# Patient Record
Sex: Female | Born: 1970 | Race: White | Hispanic: No | Marital: Married | Smoking: Never smoker
Health system: Southern US, Community
[De-identification: ages and names within clinical notes are randomized; demographics above are authoritative.]

## PROBLEM LIST (undated history)

## (undated) DIAGNOSIS — Z87442 Personal history of urinary calculi: Secondary | ICD-10-CM

## (undated) DIAGNOSIS — M810 Age-related osteoporosis without current pathological fracture: Secondary | ICD-10-CM

## (undated) DIAGNOSIS — E785 Hyperlipidemia, unspecified: Secondary | ICD-10-CM

## (undated) HISTORY — PX: WISDOM TOOTH EXTRACTION: SHX21

## (undated) HISTORY — DX: Personal history of urinary calculi: Z87.442

## (undated) HISTORY — DX: Hyperlipidemia, unspecified: E78.5

## (undated) HISTORY — DX: Age-related osteoporosis without current pathological fracture: M81.0

---

## 2000-12-06 ENCOUNTER — Other Ambulatory Visit: Admission: RE | Admit: 2000-12-06 | Discharge: 2000-12-06 | Payer: Self-pay | Admitting: *Deleted

## 2001-07-21 ENCOUNTER — Other Ambulatory Visit: Admission: RE | Admit: 2001-07-21 | Discharge: 2001-07-21 | Payer: Self-pay | Admitting: Obstetrics and Gynecology

## 2002-02-06 ENCOUNTER — Inpatient Hospital Stay (HOSPITAL_COMMUNITY): Admission: AD | Admit: 2002-02-06 | Discharge: 2002-02-07 | Payer: Self-pay | Admitting: Obstetrics and Gynecology

## 2002-03-18 ENCOUNTER — Other Ambulatory Visit: Admission: RE | Admit: 2002-03-18 | Discharge: 2002-03-18 | Payer: Self-pay | Admitting: Obstetrics and Gynecology

## 2002-08-18 ENCOUNTER — Emergency Department (HOSPITAL_COMMUNITY): Admission: EM | Admit: 2002-08-18 | Discharge: 2002-08-19 | Payer: Self-pay | Admitting: Emergency Medicine

## 2002-08-18 ENCOUNTER — Encounter: Payer: Self-pay | Admitting: Emergency Medicine

## 2003-04-13 ENCOUNTER — Other Ambulatory Visit: Admission: RE | Admit: 2003-04-13 | Discharge: 2003-04-13 | Payer: Self-pay | Admitting: Obstetrics and Gynecology

## 2004-03-29 ENCOUNTER — Inpatient Hospital Stay (HOSPITAL_COMMUNITY): Admission: AD | Admit: 2004-03-29 | Discharge: 2004-03-31 | Payer: Self-pay | Admitting: Obstetrics and Gynecology

## 2004-05-16 ENCOUNTER — Other Ambulatory Visit: Admission: RE | Admit: 2004-05-16 | Discharge: 2004-05-16 | Payer: Self-pay | Admitting: Obstetrics and Gynecology

## 2005-07-12 ENCOUNTER — Other Ambulatory Visit: Admission: RE | Admit: 2005-07-12 | Discharge: 2005-07-12 | Payer: Self-pay | Admitting: Obstetrics and Gynecology

## 2005-10-21 ENCOUNTER — Encounter: Admission: RE | Admit: 2005-10-21 | Discharge: 2005-10-21 | Payer: Self-pay | Admitting: Obstetrics and Gynecology

## 2007-07-16 ENCOUNTER — Encounter: Admission: RE | Admit: 2007-07-16 | Discharge: 2007-07-16 | Payer: Self-pay | Admitting: Obstetrics and Gynecology

## 2011-12-26 ENCOUNTER — Encounter: Payer: Self-pay | Admitting: *Deleted

## 2011-12-27 ENCOUNTER — Other Ambulatory Visit: Payer: Self-pay | Admitting: Cardiology

## 2012-01-02 ENCOUNTER — Ambulatory Visit (HOSPITAL_COMMUNITY)
Admission: RE | Admit: 2012-01-02 | Discharge: 2012-01-02 | Disposition: A | Discharge status: Home / Self Care | Payer: BC Managed Care – PPO | Source: Ambulatory Visit | Attending: Cardiology | Admitting: Cardiology

## 2012-01-02 DIAGNOSIS — R079 Chest pain, unspecified: Secondary | ICD-10-CM

## 2012-01-02 DIAGNOSIS — J90 Pleural effusion, not elsewhere classified: Secondary | ICD-10-CM | POA: Insufficient documentation

## 2012-01-02 MED ORDER — NITROGLYCERIN 0.4 MG SL SUBL
SUBLINGUAL_TABLET | SUBLINGUAL | Status: AC
  Administered 2012-01-02: 0.4 mg via ORAL
  Filled 2012-01-02: qty 25

## 2012-01-02 MED ORDER — SODIUM CHLORIDE 0.9 % IV SOLN: Freq: Once | INTRAVENOUS | Status: DC

## 2012-01-02 MED ORDER — METOPROLOL TARTRATE 1 MG/ML IV SOLN
INTRAVENOUS | Status: AC
  Administered 2012-01-02: 5 mg
  Filled 2012-01-02: qty 20

## 2012-01-02 MED ORDER — IOHEXOL 350 MG/ML SOLN
80.0000 mL | Freq: Once | INTRAVENOUS | Status: AC | PRN
  Administered 2012-01-02: 80 mL via INTRAVENOUS

## 2012-01-02 MED ORDER — METOPROLOL TARTRATE 1 MG/ML IV SOLN
5.0000 mg | INTRAVENOUS | Status: DC | PRN
  Administered 2012-01-02: 5 mg via INTRAVENOUS

## 2012-01-10 ENCOUNTER — Institutional Professional Consult (permissible substitution): Payer: BC Managed Care – PPO | Admitting: Pulmonary Disease

## 2012-01-23 ENCOUNTER — Encounter: Payer: Self-pay | Admitting: Pulmonary Disease

## 2012-01-24 ENCOUNTER — Ambulatory Visit (INDEPENDENT_AMBULATORY_CARE_PROVIDER_SITE_OTHER): Payer: BC Managed Care – PPO | Admitting: Pulmonary Disease

## 2012-01-24 ENCOUNTER — Encounter: Payer: Self-pay | Admitting: Pulmonary Disease

## 2012-01-24 VITALS — BP 122/86 | HR 68 | Temp 98.1°F | Ht 66.5 in | Wt 127.4 lb

## 2012-01-24 DIAGNOSIS — R5383 Other fatigue: Secondary | ICD-10-CM

## 2012-01-24 DIAGNOSIS — R5381 Other malaise: Secondary | ICD-10-CM

## 2012-01-24 NOTE — Assessment & Plan Note (Signed)
This is a very difficult case in that it is hard to tell how much of the patient's complaint is due to fatigue versus true sleepiness.  There is nothing by history or exam to suggest sleep disordered breathing or narcolepsy, however I cannot exclude sleep disruptions due to periodic limb movements, arrhythmias, or occult nocturnal seizures.  She could potentially have idiopathic hypersomnia as well, questionably related to some type of autoimmune disease.  It is very difficult to know if the patient would benefit from a sleep evaluation, which would include NPSG And M. SLT the following morning.  I have asked the patient to consider further testing if she feels strongly her symptoms include true sleepiness.  She will discuss with her family and let me know.

## 2012-01-24 NOTE — Progress Notes (Signed)
  Subjective:    Patient ID: Janice Fox, female    DOB: [birth year], 41 y.o.   MRN: [id number]  HPI The patient is a 41 year old female who I've been asked to see for evaluation of fatigue versus sleepiness.  The patient was diagnosed with fifth disease proximally 4 years ago, and since that time she has had progressive fatigue.  She thinks she also has some sleepiness as well, however has a very difficult time defining this.  She states that her "eyes feel heavy" but she does not fall asleep.  This had worsened in January and February of this year, but now has improved.  The patient has been told that she does not snore or kick during the night, and she denies any symptoms consistent with restless leg syndrome.  She has no difficulties falling asleep or maintaining sleep, and feels that her sleep overall during the night is adequate.  However, she does not feel rested in the mornings upon arising.  She does not take naps during the day except on beer he rare occasions, and her Epworth score today is only 7.  She stays very busy in the evenings, and really doesn't watch television much or sit still.  The patient denies any sleepiness with driving, but does state that her "eyes feel heavy" the patient has never had any issues with daytime sleepiness during her teenage years or 78s.  She denies any history suggestive of narcolepsy.   Review of Systems  Constitutional: Positive for appetite change. Negative for fever and unexpected weight change.  HENT: Negative for ear pain, nosebleeds, congestion, sore throat, rhinorrhea, sneezing, trouble swallowing, dental problem, postnasal drip and sinus pressure.   Eyes: Negative for redness and itching.  Respiratory: Positive for shortness of breath. Negative for cough, chest tightness and wheezing.   Cardiovascular: Positive for chest pain. Negative for palpitations and leg swelling.  Gastrointestinal: Negative for nausea and vomiting.  Genitourinary:  Negative for dysuria.  Musculoskeletal: Positive for joint swelling.  Skin: Negative for rash.  Neurological: Positive for headaches.  Hematological: Does not bruise/bleed easily.  Psychiatric/Behavioral: Negative for dysphoric mood. The patient is not nervous/anxious.        Objective:   Physical Exam Constitutional:  Thin, no acute distress  HENT:  Nares patent without discharge  Oropharynx without exudate, palate and uvula are normal  Eyes:  Perrla, eomi, no scleral icterus  Neck:  No JVD, no TMG  Cardiovascular:  Normal rate, regular rhythm, no rubs or gallops.  No murmurs        Intact distal pulses  Pulmonary :  Normal breath sounds, no stridor or respiratory distress   No rales, rhonchi, or wheezing  Abdominal:  Soft, nondistended, bowel sounds present.  No tenderness noted.   Musculoskeletal:  No lower extremity edema noted.  Lymph Nodes:  No cervical lymphadenopathy noted  Skin:  No cyanosis noted  Neurologic:  Alert, appropriate, moves all 4 extremities without obvious deficit.         Assessment & Plan:

## 2012-01-24 NOTE — Patient Instructions (Signed)
Consider doing nocturnal sleep study followed by daytime nap study to evaluate for true sleepiness vs fatigue Please call me with your decision.

## 2013-07-11 IMAGING — CT CT HEART MORP W/ CTA COR W/ SCORE W/ CA W/CM &/OR W/O CM
1 of 5 series · 11 of 20 positions shown, 14 images · IV contrast (CONTRAST)
Comparison: None
COMPARISON: None

<!--  IDXRADR:ADDEND:BEGIN -->Addendum Begins
<!--  IDXRADR:ADDEND:INNER_BEGIN -->OVER-READ INTERPRETATION - CT CHEST

The following report is an over-read performed by radiologist Dr.
[DATE].  This over-read does not include interpretation of
cardiac or coronary anatomy or pathology.  The CTA interpretation
by the cardiologist is attached.
INDICATION: Chest Pain
PROTOCOL: The patient was scanned on a Philips 256 scanner.
Gantry rotation speed was 300 msec.  Collimation was .9mm.  The
patient received 10 mg of lopresser.  Average HR during the scan
was 62 bpm.  SL nitro was given after nonconstrast axial calcium
scoring.  A prospective scan using 5% padding at 78% of the R-R
interval was triggered in the descending thoracic aorta at 111
HU's.  iDose was [DATE] with mA reduced to 300 to save dose.  The
patient received 80 cc of contrast at 5cc/sec.  The 3D date set was
reviewed on a [employer] work station using CMPR, MIP and VRT modes.

[Series 9: w/ edge cor., 78.0% · axial · 0.49mm/px · z∈[-248,-144]mm · 11 of 276 slices shown, 14 images]
[im 23/276  vessel]
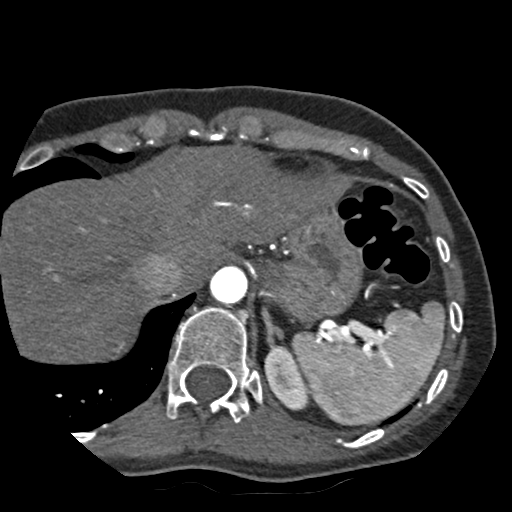
[im 23/276  lung]
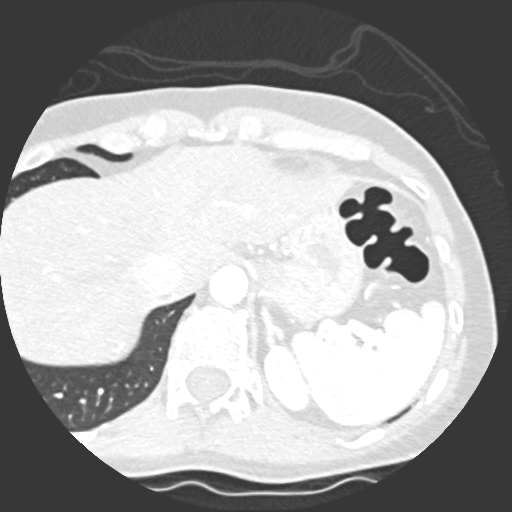
[im 46/276  vessel]
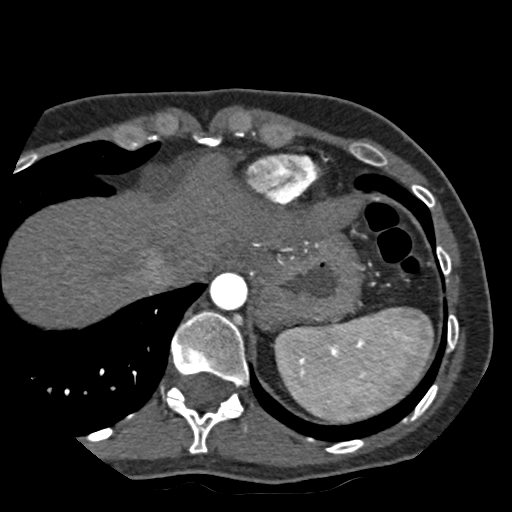
[im 69/276  vessel]
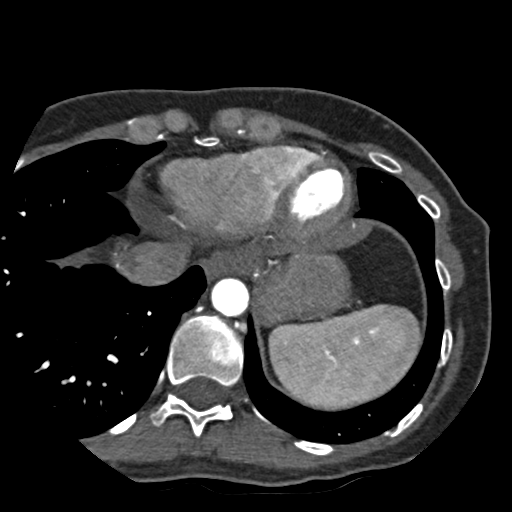
[im 92/276  vessel]
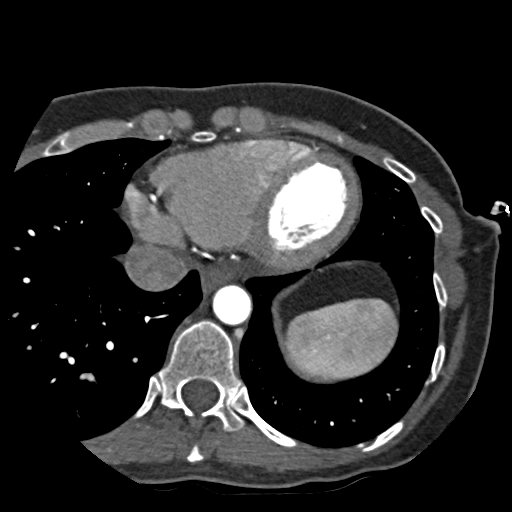
[im 115/276  vessel]
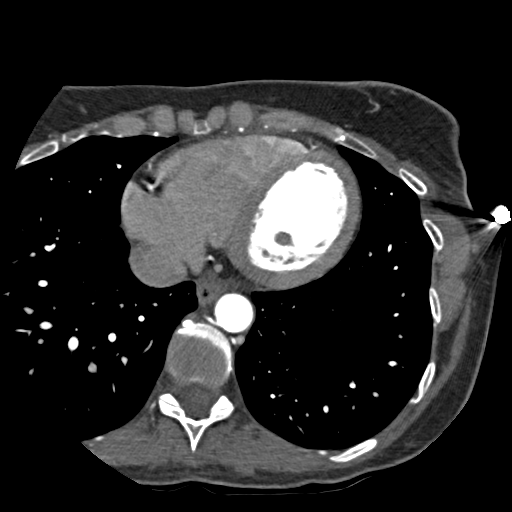
[im 115/276  lung]
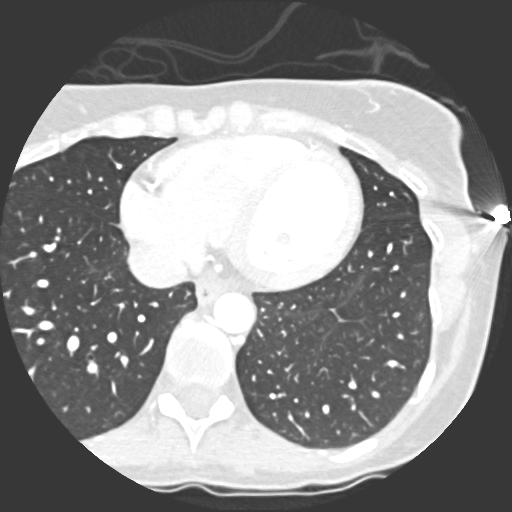
[im 138/276  vessel]
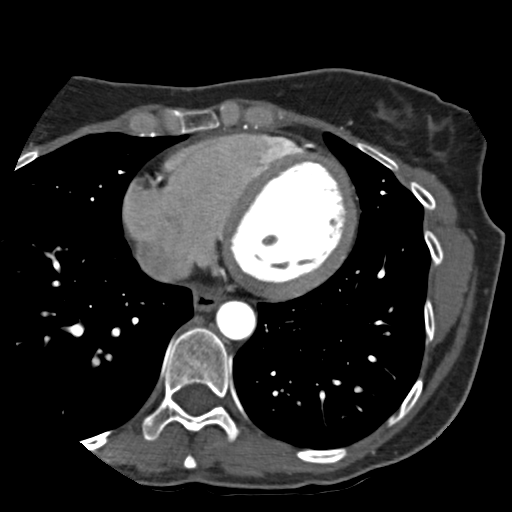
[im 161/276  vessel]
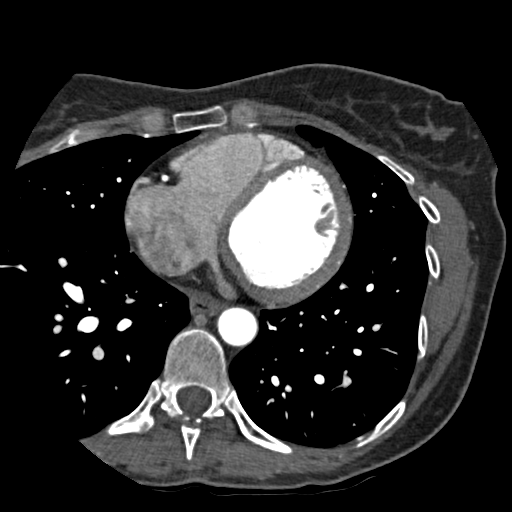
[im 184/276  vessel]
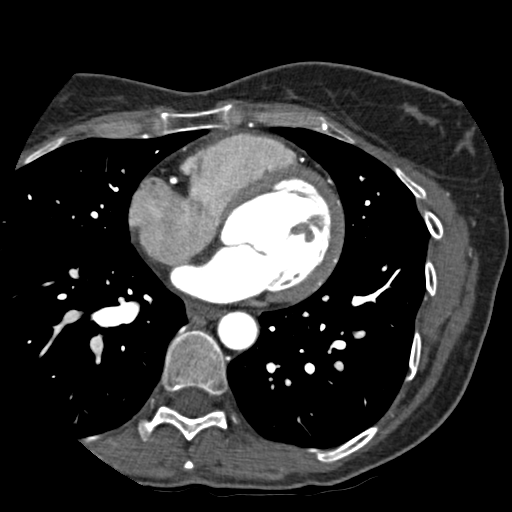
[im 207/276  vessel]
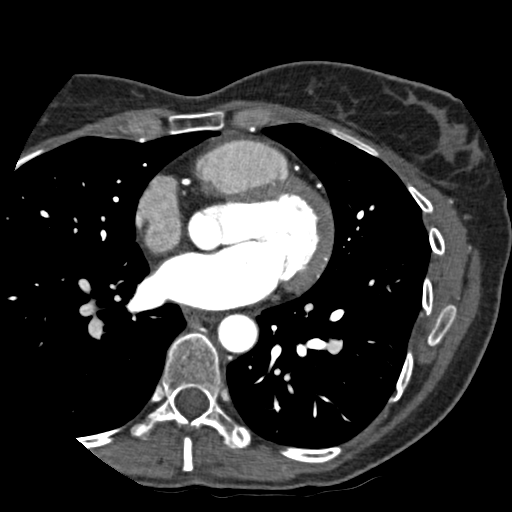
[im 207/276  lung]
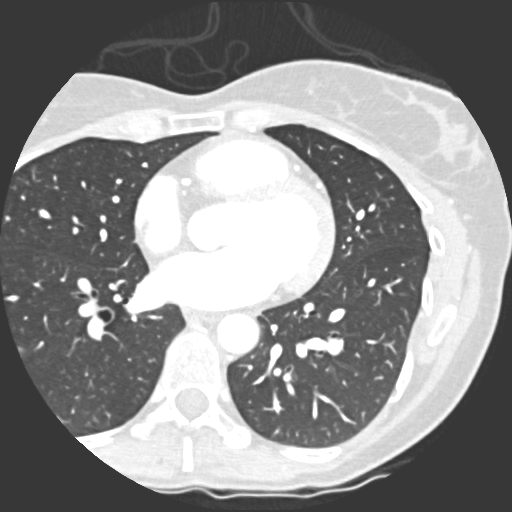
[im 230/276  vessel]
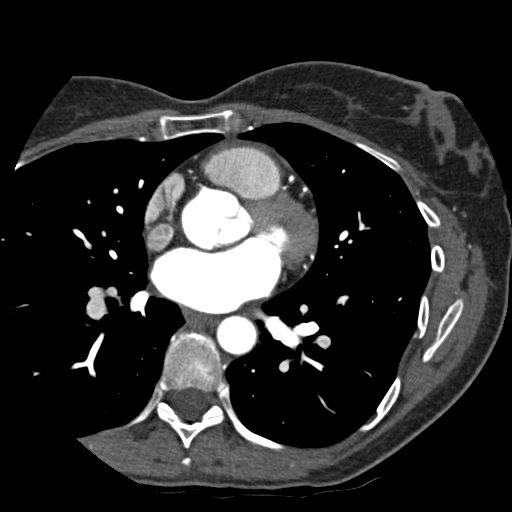
[im 253/276  vessel]
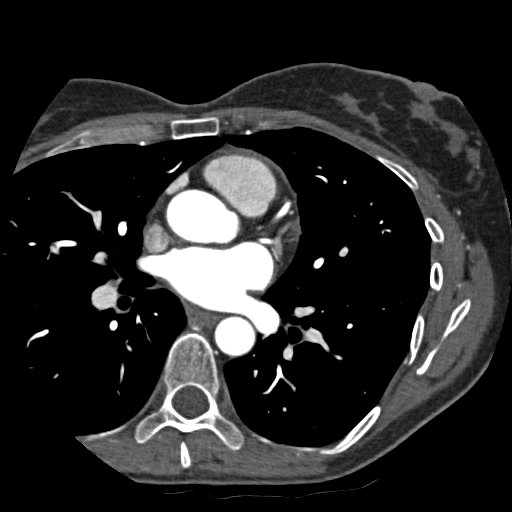

[11 of 20 positions shown; findings below may reference images not displayed]

FINDINGS: Visualized lung fields are clear.  No pleural effusions.
Small pericardial effusion noted.  No adenopathy in the visualized
lower hila or mediastinum.  Chest wall soft tissues are
unremarkable.  No acute bony abnormality.  Visualized upper abdomen
unremarkable.
IMPRESSION: Small pericardial effusion.  No additional significant extracardiac
abnormality.

Cardiac CT:
FINDINGS: Calcium Score: 0

Noncardiac:  See separate report from [REDACTED].  No
significant findings on lung or soft tissue windows

Coronary Arteries:

Right dominant with no anomalies

LM: normal

LAD: normal distal vessel is small

      D1: large and normal

Circumflex: normal

      OM1: normal and large

      AV groove: normal

RCA: dominant and normal

      PDA: normal
      PLA: small and normal

Ascending Aorta:  Normal measuring 31mm
IMPRESSION: 1)    Calcium Score 0

2)    Normal right dominant coronary arteries
3)    No significant non coronary findings see separate report from

<!--  IDXRADR:ADDEND:INNER_END -->Addendum Ends
<!--  IDXRADR:ADDEND:END -->OVER-READ INTERPRETATION - CT CHEST

The following report is an over-read performed by radiologist Dr.
[DATE].  This over-read does not include interpretation of
cardiac or coronary anatomy or pathology.  The CTA interpretation
by the cardiologist is attached.
FINDINGS: Visualized lung fields are clear.  No pleural effusions.
Small pericardial effusion noted.  No adenopathy in the visualized
lower hila or mediastinum.  Chest wall soft tissues are
unremarkable.  No acute bony abnormality.  Visualized upper abdomen
unremarkable.
IMPRESSION: Small pericardial effusion.  No additional significant extracardiac
abnormality.

## 2021-05-29 LAB — HM DEXA SCAN

## 2021-05-31 LAB — HM PAP SMEAR: HM Pap smear: NORMAL

## 2022-03-26 ENCOUNTER — Ambulatory Visit: Payer: BC Managed Care – PPO | Admitting: Nurse Practitioner

## 2022-03-26 ENCOUNTER — Encounter: Payer: Self-pay | Admitting: Nurse Practitioner

## 2022-03-26 VITALS — BP 122/66 | HR 67 | Temp 97.5°F | Ht 65.0 in | Wt 113.0 lb

## 2022-03-26 DIAGNOSIS — E782 Mixed hyperlipidemia: Secondary | ICD-10-CM

## 2022-03-26 DIAGNOSIS — R0681 Apnea, not elsewhere classified: Secondary | ICD-10-CM | POA: Diagnosis not present

## 2022-03-26 DIAGNOSIS — Z833 Family history of diabetes mellitus: Secondary | ICD-10-CM | POA: Diagnosis not present

## 2022-03-26 DIAGNOSIS — M810 Age-related osteoporosis without current pathological fracture: Secondary | ICD-10-CM

## 2022-03-26 NOTE — Patient Instructions (Addendum)
Check sleep study  Follow-up 1 year  Bone Health Information for Aging Adults Your bones do more than support your body. They also store calcium. The inside of your bones (marrow) makes blood cells. Maintaining bone health becomes more important as you age because your bones replace all their cells about every 10 years. Around age 53, it gets harder to replace those cells, and bones can become weak. Weak bones can lead to osteoporosis and breaks (fractures). Falls also become more likely, which can cause fractures. The good news is that, with diet and exercise, you can improve and maintain your bone health at any age. How to eat for bone health A balanced diet can supply many of the vitamins, minerals, and proteins you need for bone health. Older adults need to make sure they get enough calcium, vitamin D, and magnesium. You may need more of these as you age. Calcium  Calcium is the most important (essential) mineral for bone health. The daily requirement for calcium for adult men aged 60-70 years is 1,000 mg. For adult women aged 67-70 years, it is 1,200 mg. At age 4 or older, it is 1,200 mg for both men and women. Sources of calcium in your diet include: Dairy foods like milk, yogurt, and cheese. Dairy foods are the best sources. If you cannot eat dairy, you may need a calcium supplement. Leafy, dark green vegetables. These include collard greens, kale, broccoli, bok choy, and okra. Fatty fish, like sardines and canned salmon with bones. Almonds. Tofu.  Vitamin D You need vitamin D for bone health because it helps your body absorb calcium from your diet. It is not found naturally in many foods. Many people can benefit from taking a supplement. The daily requirement for vitamin D at age 79 or older is 600-1,000 international units (IU). Dietary sources for vitamin D include: Fatty fish, such as swordfish, salmon, sardine, and mackerel. Foods that have vitamin D added to them (are fortified),  like cereal and dairy products. Egg yolks. Magnesium Magnesium helps your body use both calcium and vitamin D. The recommended daily intake for adult men is 400-420 mg. For adult women, it is 310-320 mg. Dietary sources of magnesium include: Green vegetables, such as collard greens, kale, bok choy, and okra. Poppy, sesame, and chia seeds. Legumes, including peas and beans. Whole grains. Avocados. Nuts. If you drink alcohol regularly or take a type of antacid called a proton pump inhibitor, you might benefit from a magnesium supplement. How to exercise for bone health Exercise is important for bone health because it strengthens bones and muscles. Bones are living organs that get stronger when you exercise them, just like your heart and other muscles. Muscles support your bones and protect your joints. Strong muscles also help prevent bone loss and falls. Weight-bearing exercises and resistance exercises are the two types of exercise that are most important for bone health. Weight-bearing exercises include running or walking, climbing stairs, and playing sports like tennis. Resistance exercises include those done using free weights, weight machines, or resistance bands. Try to get at least 30 minutes of exercise every day. You can also include stretching, balance, and flexibility exercises, like yoga or tai chi. These lower your risk of falls. Follow these instructions at home Ask your health care provider if: You need a bone density test. This is especially important for women older than 82 and men older than 70. You have been losing height. Loss of height may be a sign of weakening bones  in your spine. Any of your medicines or medical conditions could affect your bone health. He or she could recommend an exercise program that is safe for you. Be sure it includes both weight-bearing and resistance exercises. Do not use any products that contain nicotine or tobacco. These products include  cigarettes, chewing tobacco, and vaping devices, such as e-cigarettes. These can reduce bone density. If you need help quitting, ask your health care provider. Drink alcohol in moderation. Alcohol reduces bone density and increases your risk for falls. If you drink alcohol: Limit how much you have to: 0-1 drink a day for women who are not pregnant. 0-2 drinks a day for men. Know how much alcohol is in a drink. In the [place] one drink equals one 12 oz bottle of beer (355 mL), one 5 oz glass of wine (148 mL), or one 1 oz glass of hard liquor (44 mL). Take over-the-counter and prescription medicines only as told by your health care provider. Ask your health care provider if you could benefit from taking calcium, vitamin D, or magnesium supplements. Keep all follow-up visits. This is important. Where to find more information American Bone Health: CardKnowledge.fi American Academy of Orthopaedic Surgeons: orthoinfo.[place]: bones.SouthExposed.es Summary Maintaining your bone health becomes more important as you age. You can improve and maintain your bone health with diet and exercise at any age. A balanced diet can supply many of the vitamins, minerals, and proteins you need for bone health. Older adults need to make sure they get enough calcium, vitamin D, and magnesium. Ask your health care provider if you could benefit from taking calcium, vitamin D, or magnesium supplements. Exercise is important for bone health because it strengthens bones and muscles. Do both weight-bearing and resistance exercises. This information is not intended to replace advice given to you by your health care provider. Make sure you discuss any questions you have with your health care provider. Document Revised: 04/04/2021 Document Reviewed: 04/04/2021 Elsevier Patient Education  2023 Elsevier Inc.  Preventive Care 62-78 Years Old, Female Preventive care refers to lifestyle choices and  visits with your health care provider that can promote health and wellness. Preventive care visits are also called wellness exams. What can I expect for my preventive care visit? Counseling Your health care provider may ask you questions about your: Medical history, including: Past medical problems. Family medical history. Pregnancy history. Current health, including: Menstrual cycle. Method of birth control. Emotional well-being. Home life and relationship well-being. Sexual activity and sexual health. Lifestyle, including: Alcohol, nicotine or tobacco, and drug use. Access to firearms. Diet, exercise, and sleep habits. Work and work Statistician. Sunscreen use. Safety issues such as seatbelt and bike helmet use. Physical exam Your health care provider will check your: Height and weight. These may be used to calculate your BMI (body mass index). BMI is a measurement that tells if you are at a healthy weight. Waist circumference. This measures the distance around your waistline. This measurement also tells if you are at a healthy weight and may help predict your risk of certain diseases, such as type 2 diabetes and high blood pressure. Heart rate and blood pressure. Body temperature. Skin for abnormal spots. What immunizations do I need?  Vaccines are usually given at various ages, according to a schedule. Your health care provider will recommend vaccines for you based on your age, medical history, and lifestyle or other factors, such as travel or where you work. What tests do I need? Screening  Your health care provider may recommend screening tests for certain conditions. This may include: Lipid and cholesterol levels. Diabetes screening. This is done by checking your blood sugar (glucose) after you have not eaten for a while (fasting). Pelvic exam and Pap test. Hepatitis B test. Hepatitis C test. HIV (human immunodeficiency virus) test. STI (sexually transmitted infection)  testing, if you are at risk. Lung cancer screening. Colorectal cancer screening. Mammogram. Talk with your health care provider about when you should start having regular mammograms. This may depend on whether you have a family history of breast cancer. BRCA-related cancer screening. This may be done if you have a family history of breast, ovarian, tubal, or peritoneal cancers. Bone density scan. This is done to screen for osteoporosis. Talk with your health care provider about your test results, treatment options, and if necessary, the need for more tests. Follow these instructions at home: Eating and drinking  Eat a diet that includes fresh fruits and vegetables, whole grains, lean protein, and low-fat dairy products. Take vitamin and mineral supplements as recommended by your health care provider. Do not drink alcohol if: Your health care provider tells you not to drink. You are pregnant, may be pregnant, or are planning to become pregnant. If you drink alcohol: Limit how much you have to 0-1 drink a day. Know how much alcohol is in your drink. In the [place] one drink equals one 12 oz bottle of beer (355 mL), one 5 oz glass of wine (148 mL), or one 1 oz glass of hard liquor (44 mL). Lifestyle Brush your teeth every morning and night with fluoride toothpaste. Floss one time each day. Exercise for at least 30 minutes 5 or more days each week. Do not use any products that contain nicotine or tobacco. These products include cigarettes, chewing tobacco, and vaping devices, such as e-cigarettes. If you need help quitting, ask your health care provider. Do not use drugs. If you are sexually active, practice safe sex. Use a condom or other form of protection to prevent STIs. If you do not wish to become pregnant, use a form of birth control. If you plan to become pregnant, see your health care provider for a prepregnancy visit. Take aspirin only as told by your health care provider. Make sure  that you understand how much to take and what form to take. Work with your health care provider to find out whether it is safe and beneficial for you to take aspirin daily. Find healthy ways to manage stress, such as: Meditation, yoga, or listening to music. Journaling. Talking to a trusted person. Spending time with friends and family. Minimize exposure to UV radiation to reduce your risk of skin cancer. Safety Always wear your seat belt while driving or riding in a vehicle. Do not drive: If you have been drinking alcohol. Do not ride with someone who has been drinking. When you are tired or distracted. While texting. If you have been using any mind-altering substances or drugs. Wear a helmet and other protective equipment during sports activities. If you have firearms in your house, make sure you follow all gun safety procedures. Seek help if you have been physically or sexually abused. What's next? Visit your health care provider once a year for an annual wellness visit. Ask your health care provider how often you should have your eyes and teeth checked. Stay up to date on all vaccines. This information is not intended to replace advice given to you by your health care provider.  Make sure you discuss any questions you have with your health care provider. Document Revised: 04/18/2021 Document Reviewed: 04/18/2021 Elsevier Patient Education  [place].

## 2022-03-26 NOTE — Progress Notes (Signed)
New Patient Office Visit  Subjective    Patient ID: Janice Fox, female    DOB: 19-Oct-1971  Age: 51 y.o. MRN: [id number]  CC:  Chief Complaint  Patient presents with   Establish Care    HPI Janice Fox presents to re-establish care. She is currently up to date with her mammogram (has one scheduled for this July), PAP, DEXA (done yearly since insurance covers it) and Cologuard. Has Osteoporosis, treated with Boniva x 5 years, currently on medication holiday and taking otc calcium-vitamin D daily. Would like to have regular lab orders given so that she may go to [facility].   Outpatient Encounter Medications as of 03/26/2022  Medication Sig   Calcium Carb-Cholecalciferol (CALCIUM 600+D) 600-20 MG-MCG TABS    No facility-administered encounter medications on file as of 03/26/2022.    Past Medical History:  Diagnosis Date   History of kidney stones    Hyperlipidemia    Osteoporosis     Past Surgical History:  Procedure Laterality Date   WISDOM TOOTH EXTRACTION      Family History  Problem Relation Age of Onset   Hypertension Mother    Osteoporosis Mother    Hyperlipidemia Father    Hyperlipidemia Brother    Heart disease Father    Rheum arthritis Maternal Grandmother    Pancreatic cancer Maternal Grandfather     Social History   Socioeconomic History   Marital status: Married    Spouse name: Raelin Pixler   Number of children: 3   Years of education: Not on file   Highest education level: Not on file  Occupational History   Occupation: homemaker  Tobacco Use   Smoking status: Never   Smokeless tobacco: Never  Substance and Sexual Activity   Alcohol use: No   Drug use: No   Sexual activity: Yes    Partners: Male    Birth control/protection: Post-menopausal  Other Topics Concern   Not on file  Social History Narrative   Not on file   Social Determinants of Health   Financial Resource Strain: Low Risk    Difficulty of Paying Living Expenses: Not  hard at all  Food Insecurity: No Food Insecurity   Worried About Programme researcher, broadcasting/film/video in the Last Year: Never true   Ran Out of Food in the Last Year: Never true  Transportation Needs: No Transportation Needs   Lack of Transportation (Medical): No   Lack of Transportation (Non-Medical): No  Physical Activity: Insufficiently Active   Days of Exercise per Week: 4 days   Minutes of Exercise per Session: 30 min  Stress: No Stress Concern Present   Feeling of Stress : Not at all  Social Connections: Socially Integrated   Frequency of Communication with Friends and Family: More than three times a week   Frequency of Social Gatherings with Friends and Family: More than three times a week   Attends Religious Services: More than 4 times per year   Active Member of [employer] or Organizations: Yes   Attends Banker Meetings: 1 to 4 times per year   Marital Status: Married  Catering manager Violence: Not At Risk   Fear of Current or Ex-Partner: No   Emotionally Abused: No   Physically Abused: No   Sexually Abused: No    Review of Systems  Constitutional:  Negative for chills, fever and malaise/fatigue.  HENT:  Negative for ear pain, sinus pain and sore throat.   Respiratory:  Negative for cough and  shortness of breath.   Cardiovascular:  Negative for chest pain.  Musculoskeletal:  Negative for myalgias.  Neurological:  Negative for headaches.       Objective    BP 122/66   Pulse 67   Temp (!) 97.5 F (36.4 C)   Ht 5\' 5"  (1.651 m)   Wt 113 lb (51.3 kg)   SpO2 99%   BMI 18.80 kg/m    Physical Exam Vitals reviewed.  Constitutional:      Appearance: Normal appearance.  HENT:     Head: Normocephalic.     Right Ear: Tympanic membrane normal.     Left Ear: Tympanic membrane normal.     Nose: Nose normal.     Mouth/Throat:     Mouth: Mucous membranes are moist.  Eyes:     Pupils: Pupils are equal, round, and reactive to light.  Cardiovascular:     Rate and Rhythm:  Normal rate and regular rhythm.     Pulses: Normal pulses.     Heart sounds: Normal heart sounds.  Pulmonary:     Effort: Pulmonary effort is normal.     Breath sounds: Normal breath sounds.  Abdominal:     General: Bowel sounds are normal.     Palpations: Abdomen is soft.  Musculoskeletal:        General: Normal range of motion.     Cervical back: Neck supple.  Skin:    General: Skin is warm and dry.     Capillary Refill: Capillary refill takes less than 2 seconds.  Neurological:     General: No focal deficit present.     Mental Status: She is alert and oriented to person, place, and time.  Psychiatric:        Mood and Affect: Mood normal.        Behavior: Behavior normal.     Assessment & Plan:    1. Mixed hyperlipidemia - CBC with Differential/Platelet - Comprehensive metabolic panel - Lipid panel - Hemoglobin A1c  2. Osteoporosis without current pathological fracture, unspecified osteoporosis type - Calcium Carb-Cholecalciferol (CALCIUM 600+D) 600-20 MG-MCG TABS - TSH  3. Witnessed episode of apnea -possible home sleep study, pt will call back  4. Family history of diabetes mellitus in father - Hemoglobin A1c     Check sleep study  Follow-up 1 year  Follow-up: 1 year  I, , NP, have reviewed all documentation for this visit. The documentation on 03/26/22 for the exam, diagnosis, procedures, and orders are all accurate and complete.   Signed05/25/23, DNP 03/26/22 at 4:46 PM

## 2022-03-27 ENCOUNTER — Encounter: Payer: Self-pay | Admitting: Nurse Practitioner

## 2022-03-27 LAB — TSH: TSH: 1.72 mIU/L

## 2022-03-28 ENCOUNTER — Other Ambulatory Visit: Payer: Self-pay | Admitting: Nurse Practitioner

## 2022-03-28 DIAGNOSIS — R0681 Apnea, not elsewhere classified: Secondary | ICD-10-CM

## 2022-03-28 LAB — CBC WITH DIFFERENTIAL/PLATELET
Absolute Monocytes: 334 cells/uL (ref 200–950)
Basophils Absolute: 61 cells/uL (ref 0–200)
Basophils Relative: 1.3 %
Eosinophils Absolute: 61 cells/uL (ref 15–500)
Eosinophils Relative: 1.3 %
HCT: 41.8 % (ref 35.0–45.0)
Hemoglobin: 14.4 g/dL (ref 11.7–15.5)
Lymphs Abs: 1109 cells/uL (ref 850–3900)
MCH: 31.5 pg (ref 27.0–33.0)
MCHC: 34.4 g/dL (ref 32.0–36.0)
MCV: 91.5 fL (ref 80.0–100.0)
MPV: 10.9 fL (ref 7.5–12.5)
Monocytes Relative: 7.1 %
Neutro Abs: 3135 cells/uL (ref 1500–7800)
Neutrophils Relative %: 66.7 %
Platelets: 262 10*3/uL (ref 140–400)
RBC: 4.57 10*6/uL (ref 3.80–5.10)
RDW: 12.5 % (ref 11.0–15.0)
Total Lymphocyte: 23.6 %
WBC: 4.7 10*3/uL (ref 3.8–10.8)

## 2022-03-28 LAB — COMPREHENSIVE METABOLIC PANEL
AG Ratio: 2.3 (calc) (ref 1.0–2.5)
ALT: 13 U/L (ref 6–29)
AST: 16 U/L (ref 10–35)
Albumin: 4.9 g/dL (ref 3.6–5.1)
Alkaline phosphatase (APISO): 64 U/L (ref 37–153)
BUN: 14 mg/dL (ref 7–25)
CO2: 26 mmol/L (ref 20–32)
Calcium: 9.5 mg/dL (ref 8.6–10.4)
Chloride: 105 mmol/L (ref 98–110)
Creat: 0.82 mg/dL (ref 0.50–1.03)
Globulin: 2.1 g/dL (calc) (ref 1.9–3.7)
Glucose, Bld: 86 mg/dL (ref 65–99)
Potassium: 4.1 mmol/L (ref 3.5–5.3)
Sodium: 141 mmol/L (ref 135–146)
Total Bilirubin: 0.5 mg/dL (ref 0.2–1.2)
Total Protein: 7 g/dL (ref 6.1–8.1)

## 2022-03-28 LAB — HEMOGLOBIN A1C
Hgb A1c MFr Bld: 5.3 % of total Hgb (ref <5.7)
Mean Plasma Glucose: 105 mg/dL
eAG (mmol/L): 5.8 mmol/L

## 2022-03-28 LAB — LIPID PANEL
Cholesterol: 182 mg/dL (ref <200)
HDL: 67 mg/dL (ref >=50)
LDL Cholesterol (Calc): 99 mg/dL (calc)
Non-HDL Cholesterol (Calc): 115 mg/dL (calc) (ref <130)
Total CHOL/HDL Ratio: 2.7 (calc) (ref <5.0)
Triglycerides: 73 mg/dL (ref <150)

## 2022-05-08 ENCOUNTER — Encounter: Payer: Self-pay | Admitting: Nurse Practitioner

## 2023-03-27 NOTE — Progress Notes (Signed)
Subjective:  Patient ID: Janice Fox, female    DOB: 04/15/1971  Age: 52 y.o. MRN: [id number]  Chief Complaint  Patient presents with   Annual Exam    HPI Well Adult Physical: Patient here for a comprehensive physical exam.The patient reports no problems Patient did have questions about a cyst growing on the medial aspect of her MTP joint of the great toe on her right foot.  States that that does not cause her pain, give her any major issues wearing any shoes or anything.  Discussed with her the options of draining it first medication injections versus referring to an orthopedic surgeon for removal.  Decided at this time to continue to monitor it and as long as it does not bother her she will leave it alone.  Patient had concerns about doing her osteoporosis medication injections into a vein.  Stating that she had read that it could cause major issues and must not be injected into a vein.  Discussed with her that that was more than likely in reference to larger veins where the medicine could travel freely comparative to capillaries.  Stated that as long as she avoids major veins like in her arms and continues to inject in areas where she has more tissue she should not have any concerns about injecting into a major vein.  Patient also had a question about her sleep study where stated that her MVV was excessively low.  Discussed with her that that may show that her breathing rate and depth of breathing are significantly decreased while sleeping, but not significant enough to begin using a CPAP.  Do you take any herbs or supplements that were not prescribed by a doctor? no Are you taking calcium supplements? yes Are you taking aspirin daily? no  Encounter for general adult medical examination without abnormal findings  Physical ("At Risk" items are starred): Patient's last physical exam was 1 year ago .  Patient is not afflicted from Stress Incontinence and Urge Incontinence  Patient wears a  seat belts Patient has smoke detectors and has carbon monoxide detectors. Patient practices appropriate gun safety. Patient wears sunscreen with extended sun exposure. Dental Care: biannual cleanings, brushes and flosses daily. Ophthalmology/Optometry: Annual visit.  Hearing loss: none Vision impairments: Glasses  Menarche: 52 y.o Menstrual History: regular LMP:  s/p menopause Pregnancy history: 3 Safe at home: yes Self breast exams: yes  Declined shingrix, covid and tdap vaccine.     03/28/2023    8:07 AM 03/26/2022    2:03 PM  Depression screen PHQ 2/9  Decreased Interest 0 0  Down, Depressed, Hopeless 0 0  PHQ - 2 Score 0 0  Altered sleeping 0   Tired, decreased energy 0   Change in appetite 0   Feeling bad or failure about yourself  0   Trouble concentrating 0   Moving slowly or fidgety/restless 0   Suicidal thoughts 0   PHQ-9 Score 0   Difficult doing work/chores Not difficult at all          03/26/2022    2:03 PM 03/28/2023    8:07 AM  Fall Risk  Falls in the past year? 0 0  Was there an injury with Fall? 0 0  Fall Risk Category Calculator 0 0  Fall Risk Category (Retired) Low   (RETIRED) Patient Fall Risk Level Low fall risk   Patient at Risk for Falls Due to No Fall Risks No Fall Risks  Fall risk Follow up Falls evaluation  completed Falls evaluation completed             Social Hx   Social History   Socioeconomic History   Marital status: Married    Spouse name: Kenzee Schoenecker   Number of children: 3   Years of education: Not on file   Highest education level: Associate degree: academic program  Occupational History   Occupation: homemaker  Tobacco Use   Smoking status: Never   Smokeless tobacco: Never  Substance and Sexual Activity   Alcohol use: No   Drug use: No   Sexual activity: Yes    Partners: Male    Birth control/protection: Post-menopausal  Other Topics Concern   Not on file  Social History Narrative   Not on file   Social  Determinants of Health   Financial Resource Strain: Low Risk  (03/26/2023)   Overall Financial Resource Strain (CARDIA)    Difficulty of Paying Living Expenses: Not hard at all  Food Insecurity: No Food Insecurity (03/26/2023)   Hunger Vital Sign    Worried About Running Out of Food in the Last Year: Never true    Ran Out of Food in the Last Year: Never true  Transportation Needs: No Transportation Needs (03/26/2023)   PRAPARE - Administrator, Civil Service (Medical): No    Lack of Transportation (Non-Medical): No  Physical Activity: Insufficiently Active (03/26/2023)   Exercise Vital Sign    Days of Exercise per Week: 4 days    Minutes of Exercise per Session: 20 min  Stress: No Stress Concern Present (03/26/2023)   [employer] of Occupational Health - Occupational Stress Questionnaire    Feeling of Stress : Only a little  Social Connections: Socially Integrated (03/26/2023)   Social Connection and Isolation Panel [NHANES]    Frequency of Communication with Friends and Family: More than three times a week    Frequency of Social Gatherings with Friends and Family: Twice a week    Attends Religious Services: More than 4 times per year    Active Member of [employer] or Organizations: Yes    Attends Engineer, structural: More than 4 times per year    Marital Status: Married   Past Medical History:  Diagnosis Date   History of kidney stones    Hyperlipidemia    Osteoporosis    Past Surgical History:  Procedure Laterality Date   WISDOM TOOTH EXTRACTION      Family History  Problem Relation Age of Onset   Hypertension Mother    Osteoporosis Mother    Hyperlipidemia Father    Hyperlipidemia Brother    Heart disease Father    Rheum arthritis Maternal Grandmother    Pancreatic cancer Maternal Grandfather     Review of Systems  Constitutional:  Negative for chills, fatigue and fever.  HENT:  Negative for congestion, ear pain, rhinorrhea and sore throat.    Respiratory:  Negative for cough and shortness of breath.   Cardiovascular:  Negative for chest pain.  Gastrointestinal:  Negative for abdominal pain, constipation, diarrhea, nausea and vomiting.  Genitourinary:  Negative for dysuria and urgency.  Musculoskeletal:  Negative for back pain and myalgias.  Neurological:  Negative for dizziness, weakness, light-headedness and headaches.  Psychiatric/Behavioral:  Negative for dysphoric mood. The patient is not nervous/anxious.      Objective:  BP 122/68   Pulse 79   Temp (!) 96.9 F (36.1 C)   Ht 5' 5.5" (1.664 m)   Wt 121 lb (54.9  kg)   SpO2 98%   BMI 19.83 kg/m      03/28/2023    8:06 AM 03/26/2022    2:02 PM 01/24/2012    4:19 PM  BP/Weight  Systolic BP 122 122 122  Diastolic BP 68 66 86  Wt. (Lbs) 121 113 127.4  BMI 19.83 kg/m2 18.8 kg/m2 20.25 kg/m2    Physical Exam Vitals reviewed.  Constitutional:      Appearance: Normal appearance.  HENT:     Right Ear: Tympanic membrane and ear canal normal.     Left Ear: Tympanic membrane and ear canal normal.     Nose: Nose normal.     Mouth/Throat:     Mouth: Mucous membranes are moist.     Pharynx: Oropharynx is clear. No oropharyngeal exudate or posterior oropharyngeal erythema.  Eyes:     Conjunctiva/sclera: Conjunctivae normal.  Neck:     Vascular: No carotid bruit.  Cardiovascular:     Rate and Rhythm: Normal rate and regular rhythm.     Pulses: Normal pulses.     Heart sounds: Normal heart sounds.  Pulmonary:     Effort: Pulmonary effort is normal.     Breath sounds: Normal breath sounds.  Abdominal:     General: Bowel sounds are normal.     Palpations: There is no mass.     Tenderness: There is no abdominal tenderness.  Musculoskeletal:     Cervical back: Normal range of motion.  Skin:    Findings: Abscess and bruising present. No lesion or rash.          Comments: Minor bruise on abdomin from injection of osteoporosis medicine.  Cyst formation at MTP of  the great toe on the right foot. Fluctuant, mobile, nontender.  Neurological:     Mental Status: She is alert and oriented to person, place, and time.  Psychiatric:        Mood and Affect: Mood normal.        Behavior: Behavior normal.     Lab Results  Component Value Date   WBC 4.7 03/27/2022   HGB 14.4 03/27/2022   HCT 41.8 03/27/2022   PLT 262 03/27/2022   GLUCOSE 86 03/27/2022   CHOL 182 03/27/2022   TRIG 73 03/27/2022   HDL 67 03/27/2022   LDLCALC 99 03/27/2022   ALT 13 03/27/2022   AST 16 03/27/2022   NA 141 03/27/2022   K 4.1 03/27/2022   CL 105 03/27/2022   CREATININE 0.82 03/27/2022   BUN 14 03/27/2022   CO2 26 03/27/2022   TSH 1.72 03/27/2022   HGBA1C 5.3 03/27/2022      Assessment & Plan:  Routine medical exam Things to do to keep yourself healthy  - Exercise at least 30-45 minutes a day, 3-4 days a week.  - Eat a low-fat diet with lots of fruits and vegetables, up to 7-9 servings per day.  - Seatbelts can save your life. Wear them always.  - Smoke detectors on every level of your home, check batteries every year.  - Eye Doctor - have an eye exam every 1-2 years  - Safe sex - if you may be exposed to STDs, use a condom.  - Alcohol -  If you drink, do it moderately, less than 2 drinks per day.  - Health Care Power of Attorney. Choose someone to speak for you if you are not able.  - Depression is common in our stressful world.If you're feeling down or losing interest in  things you normally enjoy, please come in for a visit.  - Violence - If anyone is threatening or hurting you, please call immediately.   Ganglion cyst of right foot Patient will continue to monitor and let us know if it begins to become painful or bothers her with wearing shoes.    Body mass index is 19.83 kg/m.   This is a list of the screening recommended for you and due dates:  Health Maintenance  Topic Date Due   COVID-19 Vaccine (1) Never done   DTaP/Tdap/Td vaccine (1 - Tdap)  Never done   Zoster (Shingles) Vaccine (1 of 2) Never done   Cologuard (Stool DNA test)  05/23/2023   Mammogram  05/30/2023   Flu Shot  06/05/2023   Pap Smear  05/29/2024   HPV Vaccine  Aged Out     No orders of the defined types were placed in this encounter.   Follow-up: Return in about 1 year (around 03/27/2024) for cpe.  An After Visit Summary was printed and given to the patient.  Langley Gauss, [place] [facility] [phone]

## 2023-03-28 ENCOUNTER — Ambulatory Visit (INDEPENDENT_AMBULATORY_CARE_PROVIDER_SITE_OTHER): Payer: BC Managed Care – PPO | Admitting: Physician Assistant

## 2023-03-28 ENCOUNTER — Encounter: Payer: BC Managed Care – PPO | Admitting: Nurse Practitioner

## 2023-03-28 ENCOUNTER — Encounter: Payer: Self-pay | Admitting: Physician Assistant

## 2023-03-28 VITALS — BP 122/68 | HR 79 | Temp 96.9°F | Ht 65.5 in | Wt 121.0 lb

## 2023-03-28 DIAGNOSIS — M67471 Ganglion, right ankle and foot: Secondary | ICD-10-CM | POA: Diagnosis not present

## 2023-03-28 DIAGNOSIS — Z0000 Encounter for general adult medical examination without abnormal findings: Secondary | ICD-10-CM | POA: Insufficient documentation

## 2023-03-28 NOTE — Assessment & Plan Note (Signed)
Patient will continue to monitor and let us know if it begins to become painful or bothers her with wearing shoes.

## 2023-03-28 NOTE — Assessment & Plan Note (Signed)

## 2023-04-09 ENCOUNTER — Other Ambulatory Visit: Payer: Self-pay | Admitting: Physician Assistant

## 2023-04-10 LAB — TSH: TSH: 3.06 mIU/L

## 2023-04-10 LAB — COMPLETE METABOLIC PANEL WITH GFR
AG Ratio: 2.1 (calc) (ref 1.0–2.5)
ALT: 12 U/L (ref 6–29)
AST: 17 U/L (ref 10–35)
Albumin: 4.6 g/dL (ref 3.6–5.1)
Alkaline phosphatase (APISO): 75 U/L (ref 37–153)
BUN: 14 mg/dL (ref 7–25)
CO2: 29 mmol/L (ref 20–32)
Calcium: 9.7 mg/dL (ref 8.6–10.4)
Chloride: 103 mmol/L (ref 98–110)
Creat: 0.86 mg/dL (ref 0.50–1.03)
Globulin: 2.2 g/dL (calc) (ref 1.9–3.7)
Glucose, Bld: 86 mg/dL (ref 65–99)
Potassium: 4.3 mmol/L (ref 3.5–5.3)
Sodium: 140 mmol/L (ref 135–146)
Total Bilirubin: 0.5 mg/dL (ref 0.2–1.2)
Total Protein: 6.8 g/dL (ref 6.1–8.1)
eGFR: 81 mL/min/{1.73_m2} (ref >=60)

## 2023-04-10 LAB — CBC WITH DIFFERENTIAL/PLATELET
Absolute Monocytes: 354 cells/uL (ref 200–950)
Basophils Absolute: 73 cells/uL (ref 0–200)
Basophils Relative: 1.4 %
Eosinophils Absolute: 62 cells/uL (ref 15–500)
Eosinophils Relative: 1.2 %
HCT: 39.4 % (ref 35.0–45.0)
Hemoglobin: 13.5 g/dL (ref 11.7–15.5)
Lymphs Abs: 1050 cells/uL (ref 850–3900)
MCH: 31.6 pg (ref 27.0–33.0)
MCHC: 34.3 g/dL (ref 32.0–36.0)
MCV: 92.3 fL (ref 80.0–100.0)
MPV: 10.2 fL (ref 7.5–12.5)
Monocytes Relative: 6.8 %
Neutro Abs: 3661 cells/uL (ref 1500–7800)
Neutrophils Relative %: 70.4 %
Platelets: 255 10*3/uL (ref 140–400)
RBC: 4.27 10*6/uL (ref 3.80–5.10)
RDW: 12.3 % (ref 11.0–15.0)
Total Lymphocyte: 20.2 %
WBC: 5.2 10*3/uL (ref 3.8–10.8)

## 2023-04-10 LAB — LIPID PANEL
Cholesterol: 200 mg/dL — ABNORMAL HIGH (ref <200)
HDL: 73 mg/dL (ref >=50)
LDL Cholesterol (Calc): 110 mg/dL (calc) — ABNORMAL HIGH
Non-HDL Cholesterol (Calc): 127 mg/dL (calc) (ref <130)
Total CHOL/HDL Ratio: 2.7 (calc) (ref <5.0)
Triglycerides: 81 mg/dL (ref <150)

## 2023-04-10 LAB — T4, FREE: Free T4: 1 ng/dL (ref 0.8–1.8)

## 2023-04-10 LAB — VITAMIN D 25 HYDROXY (VIT D DEFICIENCY, FRACTURES): Vit D, 25-Hydroxy: 34 ng/mL (ref 30–100)

## 2023-04-28 ENCOUNTER — Encounter: Payer: BC Managed Care – PPO | Admitting: Nurse Practitioner

## 2023-05-21 LAB — COLOGUARD: COLOGUARD: NEGATIVE

## 2023-06-24 ENCOUNTER — Encounter: Payer: Self-pay | Admitting: Physician Assistant

## 2023-06-25 ENCOUNTER — Other Ambulatory Visit: Payer: Self-pay | Admitting: Physician Assistant

## 2023-06-25 DIAGNOSIS — M810 Age-related osteoporosis without current pathological fracture: Secondary | ICD-10-CM

## 2023-06-25 DIAGNOSIS — E782 Mixed hyperlipidemia: Secondary | ICD-10-CM

## 2023-06-27 LAB — LAB REPORT - SCANNED: EGFR: 88

## 2023-07-15 LAB — LAB REPORT - SCANNED: Creatinine, POC: 1066 mg/dL

## 2023-08-27 ENCOUNTER — Encounter: Payer: Self-pay | Admitting: Physician Assistant

## 2023-09-15 LAB — HM DEXA SCAN

## 2023-10-13 LAB — LAB REPORT - SCANNED
Calcium: 9.6
EGFR: 86
PTH: 34

## 2023-10-14 LAB — HM MAMMOGRAPHY

## 2023-10-15 ENCOUNTER — Other Ambulatory Visit (HOSPITAL_BASED_OUTPATIENT_CLINIC_OR_DEPARTMENT_OTHER): Payer: Self-pay | Admitting: Obstetrics and Gynecology

## 2023-10-15 DIAGNOSIS — E785 Hyperlipidemia, unspecified: Secondary | ICD-10-CM

## 2023-10-22 ENCOUNTER — Ambulatory Visit (HOSPITAL_COMMUNITY)
Admission: RE | Admit: 2023-10-22 | Discharge: 2023-10-22 | Disposition: A | Discharge status: Home / Self Care | Payer: Self-pay | Source: Ambulatory Visit | Attending: Obstetrics and Gynecology | Admitting: Obstetrics and Gynecology

## 2023-10-22 DIAGNOSIS — E785 Hyperlipidemia, unspecified: Secondary | ICD-10-CM | POA: Insufficient documentation

## 2023-11-24 ENCOUNTER — Ambulatory Visit: Payer: BC Managed Care – PPO | Admitting: Physician Assistant

## 2023-11-24 VITALS — BP 128/82 | HR 66 | Temp 97.9°F | Ht 65.5 in | Wt 117.0 lb

## 2023-11-24 DIAGNOSIS — E7800 Pure hypercholesterolemia, unspecified: Secondary | ICD-10-CM | POA: Diagnosis not present

## 2023-11-24 DIAGNOSIS — N200 Calculus of kidney: Secondary | ICD-10-CM

## 2023-11-24 DIAGNOSIS — M810 Age-related osteoporosis without current pathological fracture: Secondary | ICD-10-CM

## 2023-11-24 DIAGNOSIS — M549 Dorsalgia, unspecified: Secondary | ICD-10-CM

## 2023-11-24 NOTE — Progress Notes (Unsigned)
Acute Office Visit  Subjective:    Patient ID: Janice Fox, female    DOB: Oct 10, 1971, 53 y.o.   MRN: [id number]  Chief Complaint  Patient presents with   Pain in between shoulder blades    Discussed the use of AI scribe software for clinical note transcription with the patient, who gave verbal consent to proceed.   HPI: Patient is in today for pain between her shoulders Discussed the use of AI scribe software for clinical note transcription with the patient, who gave verbal consent to proceed.  History of Present Illness   A 53 year old female with a history of osteoporosis and kidney stones presents with pain between her shoulders and upper back. The pain started after she pulled a muscle while handling firewood. Initially, the pain was localized to the right shoulder blade, but it gradually spread across her back. The pain is described as an ache and is exacerbated by walking. She also reports occasional tingling in her left arm. She has been on Prolia for her osteoporosis and suspects that the muscle pain might be a side effect of the medication. She was previously on teriparatide but was taken off due to the development of kidney stones. She also has concerns about her cholesterol levels, which were last checked in December and found to be 216. She is considering dietary changes to manage her cholesterol levels. She is currently on calcium and vitamin D supplements. She had a CT cardiac scoring done in October that showed a score of 0 in all major vessels.       Past Medical History:  Diagnosis Date   History of kidney stones    Hyperlipidemia    Osteoporosis     Past Surgical History:  Procedure Laterality Date   WISDOM TOOTH EXTRACTION      Family History  Problem Relation Age of Onset   Hypertension Mother    Osteoporosis Mother    Hyperlipidemia Father    Hyperlipidemia Brother    Heart disease Father    Rheum arthritis Maternal Grandmother    Pancreatic  cancer Maternal Grandfather     Social History   Socioeconomic History   Marital status: Married    Spouse name: Jessa Basore   Number of children: 3   Years of education: Not on file   Highest education level: Associate degree: occupational, Scientist, product/process development, or vocational program  Occupational History   Occupation: homemaker  Tobacco Use   Smoking status: Never   Smokeless tobacco: Never  Substance and Sexual Activity   Alcohol use: No   Drug use: No   Sexual activity: Yes    Partners: Male    Birth control/protection: Post-menopausal  Other Topics Concern   Not on file  Social History Narrative   Not on file   Social Drivers of Health   Financial Resource Strain: Low Risk  (11/24/2023)   Overall Financial Resource Strain (CARDIA)    Difficulty of Paying Living Expenses: Not hard at all  Food Insecurity: No Food Insecurity (11/24/2023)   Hunger Vital Sign    Worried About Running Out of Food in the Last Year: Never true    Ran Out of Food in the Last Year: Never true  Transportation Needs: No Transportation Needs (11/24/2023)   PRAPARE - Administrator, Civil Service (Medical): No    Lack of Transportation (Non-Medical): No  Physical Activity: Insufficiently Active (11/24/2023)   Exercise Vital Sign    Days of Exercise per Week: 3  days    Minutes of Exercise per Session: 30 min  Stress: No Stress Concern Present (11/24/2023)   [employer] of Occupational Health - Occupational Stress Questionnaire    Feeling of Stress : Only a little  Social Connections: Socially Integrated (11/24/2023)   Social Connection and Isolation Panel [NHANES]    Frequency of Communication with Friends and Family: More than three times a week    Frequency of Social Gatherings with Friends and Family: Three times a week    Attends Religious Services: More than 4 times per year    Active Member of Clubs or Organizations: Yes    Attends Banker Meetings: More than 4 times  per year    Marital Status: Married  Catering manager Violence: Not At Risk (03/26/2022)   Humiliation, Afraid, Rape, and Kick questionnaire    Fear of Current or Ex-Partner: No    Emotionally Abused: No    Physically Abused: No    Sexually Abused: No    Outpatient Medications Prior to Visit  Medication Sig Dispense Refill   Calcium Carb-Cholecalciferol 600-20 MG-MCG TABS Take by mouth.     Cholecalciferol (VITAMIN D3) 50 MCG (2000 UT) capsule Take 4,000 Units by mouth daily.     PROLIA 60 MG/ML SOSY injection Inject 60 mg into the skin every 6 (six) months.     Sodium Fluoride (CLINPRO 5000) 1.1 % PSTE      Calcium-Vitamin D-Vitamin K [id number] MG-UNT-MCG CHEW as directed Orally     Teriparatide 600 MCG/2.4ML SOPN      No facility-administered medications prior to visit.    No Known Allergies  Review of Systems  Constitutional:  Negative for appetite change, fatigue and fever.  HENT:  Negative for congestion, ear pain, sinus pressure and sore throat.   Respiratory:  Negative for cough, chest tightness, shortness of breath and wheezing.   Cardiovascular:  Negative for chest pain and palpitations.  Gastrointestinal:  Negative for abdominal pain, constipation, diarrhea, nausea and vomiting.  Genitourinary:  Negative for dysuria and hematuria.  Musculoskeletal:  Positive for myalgias (Pain in between shoulder blades). Negative for arthralgias, back pain and joint swelling.  Skin:  Negative for rash.  Neurological:  Positive for numbness. Negative for dizziness, weakness and headaches.  Psychiatric/Behavioral:  Negative for dysphoric mood. The patient is not nervous/anxious.        Objective:        11/24/2023    2:41 PM 03/28/2023    8:06 AM 03/26/2022    2:02 PM  Vitals with BMI  Height 5' 5.5" 5' 5.5" 5\' 5"   Weight 117 lbs 121 lbs 113 lbs  BMI 19.17 19.82 18.8  Systolic 128 122 161  Diastolic 82 68 66  Pulse 66 79 67    Orthostatic VS for the past 72 hrs (Last 3  readings):  Patient Position BP Location  11/24/23 1441 Sitting Left Arm     Physical Exam Vitals reviewed.  Constitutional:      Appearance: Normal appearance.  Neck:     Vascular: No carotid bruit.  Cardiovascular:     Rate and Rhythm: Normal rate and regular rhythm.     Heart sounds: Normal heart sounds.  Pulmonary:     Effort: Pulmonary effort is normal.     Breath sounds: Normal breath sounds.  Abdominal:     General: Bowel sounds are normal.     Palpations: Abdomen is soft.     Tenderness: There is no abdominal tenderness.  Musculoskeletal:  Thoracic back: Spasms and tenderness present. No swelling, edema, deformity or signs of trauma. Normal range of motion.  Neurological:     Mental Status: She is alert and oriented to person, place, and time.  Psychiatric:        Mood and Affect: Mood normal.        Behavior: Behavior normal.     Health Maintenance Due  Topic Date Due   COVID-19 Vaccine (1) Never done   Pneumococcal Vaccine 19-42 Years old (1 of 2 - PCV) Never done   DTaP/Tdap/Td (1 - Tdap) Never done   Zoster Vaccines- Shingrix (1 of 2) Never done   MAMMOGRAM  05/30/2023   INFLUENZA VACCINE  Never done    There are no preventive care reminders to display for this patient.   Lab Results  Component Value Date   TSH 3.06 04/09/2023   Lab Results  Component Value Date   WBC 5.2 04/09/2023   HGB 13.5 04/09/2023   HCT 39.4 04/09/2023   MCV 92.3 04/09/2023   PLT 255 04/09/2023   Lab Results  Component Value Date   NA 140 04/09/2023   K 4.3 04/09/2023   CO2 29 04/09/2023   GLUCOSE 86 04/09/2023   BUN 14 04/09/2023   CREATININE 0.86 04/09/2023   BILITOT 0.5 04/09/2023   AST 17 04/09/2023   ALT 12 04/09/2023   PROT 6.8 04/09/2023   CALCIUM 9.7 04/09/2023   EGFR 88.0 06/27/2023   Lab Results  Component Value Date   CHOL 200 (H) 04/09/2023   Lab Results  Component Value Date   HDL 73 04/09/2023   Lab Results  Component Value Date    LDLCALC 110 (H) 04/09/2023   Lab Results  Component Value Date   TRIG 81 04/09/2023   Lab Results  Component Value Date   CHOLHDL 2.7 04/09/2023   Lab Results  Component Value Date   HGBA1C 5.3 03/27/2022      Total time spent on today's visit was greater than 50 minutes, including both face-to-face time and nonface-to-face time personally spent on review of chart (labs and imaging), discussing labs and goals, discussing further work-up, treatment options, referrals to specialist if needed, reviewing outside records of pertinent, answering patient's questions, and coordinating care.  Assessment & Plan:  Pain, upper back Assessment & Plan: Pain between shoulders and upper back, possibly related to Prolia use. No signs of cardiac involvement. -Continue current management with heat and consider alternating with ice for inflammation. -Implement rotator cuff strengthening exercises with resistance bands. -Consider use of tennis ball for self-massage to alleviate muscle knots.   Age-related osteoporosis without current pathological fracture Assessment & Plan: Currently on Prolia after discontinuation of teriparatide due to kidney stones. -Continue Prolia as prescribed. -Encourage weight-bearing exercises to strengthen bones. -Continue calcium and Vitamin D supplementation. -Check calcium levels closer to next Prolia injection.   Elevated LDL cholesterol level Assessment & Plan: Total cholesterol 216, possibly elevated due to Prolia. -Encourage diet modification with increased intake of olive oil and nuts. -Consider low-dose statin if diet modification is insufficient. -Repeat cholesterol panel in a few months to assess response to diet modification.   Calcium nephrolithiasis Assessment & Plan: History of kidney stones, currently has 5mm stone in right kidney and bilateral simple renal cysts. -Continue current management. -Check calcium levels closer to next Prolia  injection. -Alert provider if increase in flank pain or symptoms suggestive of kidney stones.      No orders of the defined types  were placed in this encounter.   No orders of the defined types were placed in this encounter.        Follow-up: Return in about 6 months (around 05/23/2024).  An After Visit Summary was printed and given to the patient.   I,Lauren M Auman,acting as a Neurosurgeon for [employer], [employer].,have documented all relevant documentation on the behalf of Langley Gauss, PA,as directed by  Langley Gauss, PA while in the presence of Langley Gauss, [place].    Langley Gauss, [place] [facility] [phone]

## 2023-11-25 DIAGNOSIS — E7800 Pure hypercholesterolemia, unspecified: Secondary | ICD-10-CM | POA: Insufficient documentation

## 2023-11-25 DIAGNOSIS — M549 Dorsalgia, unspecified: Secondary | ICD-10-CM | POA: Insufficient documentation

## 2023-11-25 DIAGNOSIS — M810 Age-related osteoporosis without current pathological fracture: Secondary | ICD-10-CM | POA: Insufficient documentation

## 2023-11-25 DIAGNOSIS — N200 Calculus of kidney: Secondary | ICD-10-CM | POA: Insufficient documentation

## 2023-11-25 NOTE — Assessment & Plan Note (Signed)
Pain between shoulders and upper back, possibly related to Prolia use. No signs of cardiac involvement. -Continue current management with heat and consider alternating with ice for inflammation. -Implement rotator cuff strengthening exercises with resistance bands. -Consider use of tennis ball for self-massage to alleviate muscle knots.

## 2023-11-25 NOTE — Assessment & Plan Note (Signed)
History of kidney stones, currently has 5mm stone in right kidney and bilateral simple renal cysts. -Continue current management. -Check calcium levels closer to next Prolia injection. -Alert provider if increase in flank pain or symptoms suggestive of kidney stones.

## 2023-11-25 NOTE — Assessment & Plan Note (Signed)
Currently on Prolia after discontinuation of teriparatide due to kidney stones. -Continue Prolia as prescribed. -Encourage weight-bearing exercises to strengthen bones. -Continue calcium and Vitamin D supplementation. -Check calcium levels closer to next Prolia injection.

## 2023-11-25 NOTE — Assessment & Plan Note (Signed)
Total cholesterol 216, possibly elevated due to Prolia. -Encourage diet modification with increased intake of olive oil and nuts. -Consider low-dose statin if diet modification is insufficient. -Repeat cholesterol panel in a few months to assess response to diet modification.

## 2024-01-04 ENCOUNTER — Encounter: Payer: Self-pay | Admitting: Physician Assistant

## 2024-02-01 ENCOUNTER — Encounter: Payer: Self-pay | Admitting: Physician Assistant

## 2024-02-25 ENCOUNTER — Encounter: Payer: Self-pay | Admitting: Physician Assistant

## 2024-02-25 ENCOUNTER — Ambulatory Visit: Admitting: Physician Assistant

## 2024-02-25 VITALS — BP 110/70 | HR 65 | Temp 97.3°F | Resp 14 | Ht 65.5 in | Wt 118.0 lb

## 2024-02-25 DIAGNOSIS — M810 Age-related osteoporosis without current pathological fracture: Secondary | ICD-10-CM

## 2024-02-25 DIAGNOSIS — L282 Other prurigo: Secondary | ICD-10-CM | POA: Insufficient documentation

## 2024-02-25 DIAGNOSIS — R5383 Other fatigue: Secondary | ICD-10-CM | POA: Diagnosis not present

## 2024-02-25 DIAGNOSIS — E7800 Pure hypercholesterolemia, unspecified: Secondary | ICD-10-CM

## 2024-02-25 DIAGNOSIS — Z0000 Encounter for general adult medical examination without abnormal findings: Secondary | ICD-10-CM

## 2024-02-25 NOTE — Addendum Note (Signed)
 Addended by: Odilia Bennett on: 02/25/2024 12:10 PM   Modules accepted: Orders

## 2024-02-25 NOTE — Assessment & Plan Note (Signed)
 Will get labs before next visit Continue to monitor diet and exercise Will adjust treatment based on results

## 2024-02-25 NOTE — Patient Instructions (Signed)
 VISIT SUMMARY:  You came in today with new symptoms of itching and redness on your neck. We discussed your history of kidney stones and osteoporosis, and you mentioned recent stress. We reviewed your symptoms and possible causes, and we have a plan to monitor and treat your condition.  YOUR PLAN:  -POSSIBLE SHINGLES: Shingles is a viral infection that causes a painful rash. We suspect this might be the cause of your symptoms. Please monitor for any linear rash or blisters. If shingles develops, we will start antiviral medication. Apply Eucerin or Aquaphor after showering and at night. If you notice any changes in the rash, send photos via MyChart. Schedule a follow-up if symptoms worsen or persist.  -IRRITANT CONTACT DERMATITIS: Irritant contact dermatitis is a skin reaction to an irritant, such as a bug bite or other substance. This could also be causing your symptoms. Apply Eucerin or Aquaphor after showering and at night. Monitor for any changes in the rash or new symptoms.  -GENERAL HEALTH MAINTENANCE: We discussed managing your cholesterol and the effects of Prolia, which you are taking for osteoporosis. Your CT scan shows a low risk of plaque. We will order blood work to check your cholesterol, magnesium, vitamin D , and B12 levels. Continue with your exercise routine and healthy diet.  INSTRUCTIONS:  Please monitor your symptoms and send photos of any changes in the rash via MyChart. Schedule a follow-up appointment at the end of May. We have sent the blood work order to [facility], so please complete those tests before your next visit.

## 2024-02-25 NOTE — Progress Notes (Signed)
 Acute Office Visit  Subjective:    Patient ID: Janice Fox, female    DOB: [birth year], 53 y.o.   MRN: [id number]  Chief Complaint  Patient presents with   Rash    HPI: Patient is in today for pruritus and burning sensation on left side on the neck since 3 days ago. The area is redness and tenderness with the touch. She tried baseline but it did not help  Discussed the use of AI scribe software for clinical note transcription with the patient, who gave verbal consent to proceed.  History of Present Illness   The patient, with a history of kidney stones and osteoporosis managed with Prolia, presents with a new onset of itching and redness on the neck. The symptoms started a few days prior to the visit, initially presenting as an itch which the patient didn't think much of. However, the itch persisted and was accompanied by redness and a burning sensation, although the patient denies any pain. The patient reports that the discomfort increases when turning the head in either direction. The patient has tried applying Vaseline to the area but this has not alleviated the symptoms. The patient denies any history of allergies, asthma, eczema, or psoriasis, but mentions a previous skin condition on the face that was treated with a topical chemotherapy cream. The patient also mentions a dull ache in the same area a couple of weeks prior to the onset of the current symptoms, which resolved on its own. The patient has been under significant stress recently due to personal circumstances.      Past Medical History:  Diagnosis Date   History of kidney stones    Hyperlipidemia    Osteoporosis     Past Surgical History:  Procedure Laterality Date   WISDOM TOOTH EXTRACTION      Family History  Problem Relation Age of Onset   Hypertension Mother    Osteoporosis Mother    Hyperlipidemia Father    Hyperlipidemia Brother    Heart disease Father    Rheum arthritis Maternal Grandmother     Pancreatic cancer Maternal Grandfather     Social History   Socioeconomic History   Marital status: Married    Spouse name: Joeanne Robicheaux   Number of children: 3   Years of education: Not on file   Highest education level: Associate degree: occupational, Scientist, product/process development, or vocational program  Occupational History   Occupation: homemaker  Tobacco Use   Smoking status: Never   Smokeless tobacco: Never  Substance and Sexual Activity   Alcohol use: No   Drug use: No   Sexual activity: Yes    Partners: Male    Birth control/protection: Post-menopausal  Other Topics Concern   Not on file  Social History Narrative   Not on file   Social Drivers of Health   Financial Resource Strain: Low Risk  (11/24/2023)   Overall Financial Resource Strain (CARDIA)    Difficulty of Paying Living Expenses: Not hard at all  Food Insecurity: No Food Insecurity (11/24/2023)   Hunger Vital Sign    Worried About Running Out of Food in the Last Year: Never true    Ran Out of Food in the Last Year: Never true  Transportation Needs: No Transportation Needs (11/24/2023)   PRAPARE - Administrator, Civil Service (Medical): No    Lack of Transportation (Non-Medical): No  Physical Activity: Insufficiently Active (11/24/2023)   Exercise Vital Sign    Days of Exercise per Week:  3 days    Minutes of Exercise per Session: 30 min  Stress: No Stress Concern Present (11/24/2023)   [employer] of Occupational Health - Occupational Stress Questionnaire    Feeling of Stress : Only a little  Social Connections: Socially Integrated (11/24/2023)   Social Connection and Isolation Panel [NHANES]    Frequency of Communication with Friends and Family: More than three times a week    Frequency of Social Gatherings with Friends and Family: Three times a week    Attends Religious Services: More than 4 times per year    Active Member of Clubs or Organizations: Yes    Attends Banker Meetings: More than  4 times per year    Marital Status: Married  Catering manager Violence: Not At Risk (03/26/2022)   Humiliation, Afraid, Rape, and Kick questionnaire    Fear of Current or Ex-Partner: No    Emotionally Abused: No    Physically Abused: No    Sexually Abused: No    Outpatient Medications Prior to Visit  Medication Sig Dispense Refill   Calcium Carb-Cholecalciferol 600-20 MG-MCG TABS Take by mouth.     Cholecalciferol (VITAMIN D3) 50 MCG (2000 UT) capsule Take 4,000 Units by mouth daily.     PROLIA 60 MG/ML SOSY injection Inject 60 mg into the skin every 6 (six) months.     Sodium Fluoride (CLINPRO 5000) 1.1 % PSTE      No facility-administered medications prior to visit.    No Known Allergies  Review of Systems     Objective:        02/25/2024   10:55 AM 11/24/2023    2:41 PM 03/28/2023    8:06 AM  Vitals with BMI  Height 5' 5.5" 5' 5.5" 5' 5.5"  Weight 118 lbs 117 lbs 121 lbs  BMI 19.33 19.17 19.82  Systolic 110 128 161  Diastolic 70 82 68  Pulse 65 66 79    No data found.   Physical Exam Vitals reviewed.  Constitutional:      Appearance: Normal appearance.  Neck:     Vascular: No carotid bruit.  Cardiovascular:     Rate and Rhythm: Normal rate and regular rhythm.     Heart sounds: Normal heart sounds.  Pulmonary:     Effort: Pulmonary effort is normal.     Breath sounds: Normal breath sounds.  Abdominal:     General: Bowel sounds are normal.     Palpations: Abdomen is soft.     Tenderness: There is no abdominal tenderness.  Skin:    General: Skin is warm.     Findings: Erythema and rash present. No bruising, burn, ecchymosis, laceration, petechiae or wound. Rash is macular and purpuric. Rash is not crusting, papular, pustular or urticarial.       Neurological:     Mental Status: She is alert and oriented to person, place, and time.  Psychiatric:        Mood and Affect: Mood normal.        Behavior: Behavior normal.     Health Maintenance Due  Topic  Date Due   COVID-19 Vaccine (1) Never done   DTaP/Tdap/Td (1 - Tdap) Never done   Pneumococcal Vaccine 71-23 Years old (1 of 2 - PCV) Never done   Zoster Vaccines- Shingrix (1 of 2) Never done    There are no preventive care reminders to display for this patient.   Lab Results  Component Value Date   TSH 3.06 04/09/2023  Lab Results  Component Value Date   WBC 5.2 04/09/2023   HGB 13.5 04/09/2023   HCT 39.4 04/09/2023   MCV 92.3 04/09/2023   PLT 255 04/09/2023   Lab Results  Component Value Date   NA 140 04/09/2023   K 4.3 04/09/2023   CO2 29 04/09/2023   GLUCOSE 86 04/09/2023   BUN 14 04/09/2023   CREATININE 0.86 04/09/2023   BILITOT 0.5 04/09/2023   AST 17 04/09/2023   ALT 12 04/09/2023   PROT 6.8 04/09/2023   CALCIUM 9.6 10/09/2023   EGFR 86.0 10/09/2023   Lab Results  Component Value Date   CHOL 200 (H) 04/09/2023   Lab Results  Component Value Date   HDL 73 04/09/2023   Lab Results  Component Value Date   LDLCALC 110 (H) 04/09/2023   Lab Results  Component Value Date   TRIG 81 04/09/2023   Lab Results  Component Value Date   CHOLHDL 2.7 04/09/2023   Lab Results  Component Value Date   HGBA1C 5.3 03/27/2022       Assessment & Plan:  Pruritic rash Assessment & Plan: Differential includes shingles and irritant contact dermatitis. Symptoms suggest possible early shingles. Monitoring advised. - Monitor for linear rash or vesicles. - Initiate antivirals if shingles develops. - Apply Eucerin or Aquaphor post-shower and at night. - Send rash photos via MyChart if changes occur. - Schedule follow-up if symptoms worsen or persist.  Irritant contact dermatitis Presentation suggests irritant rash, possibly from a bug bite or other irritant. - Apply Eucerin or Aquaphor post-shower and at night. - Monitor for rash changes or new symptoms.   Elevated LDL cholesterol level Assessment & Plan: Will get labs before next visit Continue to monitor  diet and exercise Will adjust treatment based on results   Age-related osteoporosis without current pathological fracture Assessment & Plan: Controlled Continue taking Prolia injection Continue to monitor for any side effects   Fatigue, unspecified type Assessment & Plan: Continue to monitor symptoms Will get labs drawn before next visit  Orders: -     Hemoglobin A1c; Future -     Vitamin B12; Future -     Magnesium; Future  Osteoporosis without current pathological fracture, unspecified osteoporosis type Assessment & Plan: Controlled Continue taking Prolia injection Continue to monitor for any side effects  Orders: -     VITAMIN D  25 Hydroxy (Vit-D Deficiency, Fractures)  Routine medical exam Assessment & Plan: Planning on doing labs before next visit Continue to work on diet and exercise  Orders: -     COMPLETE METABOLIC PANEL WITHOUT GFR -     TSH -     VITAMIN D  25 Hydroxy (Vit-D Deficiency, Fractures) -     CBC with Differential/Platelet -     Lipid panel -     T4, free     No orders of the defined types were placed in this encounter.   Orders Placed This Encounter  Procedures   Hemoglobin A1c   Vitamin B12   Magnesium    Follow-up Plans for symptom monitoring and blood work. - Schedule follow-up at end of May. - Send blood work order to [facility].      Follow-up: No follow-ups on file.  An After Visit Summary was printed and given to the patient.  Odilia Bennett, [place] [facility] [phone]

## 2024-02-25 NOTE — Assessment & Plan Note (Signed)
 Differential includes shingles and irritant contact dermatitis. Symptoms suggest possible early shingles. Monitoring advised. - Monitor for linear rash or vesicles. - Initiate antivirals if shingles develops. - Apply Eucerin or Aquaphor post-shower and at night. - Send rash photos via MyChart if changes occur. - Schedule follow-up if symptoms worsen or persist.  Irritant contact dermatitis Presentation suggests irritant rash, possibly from a bug bite or other irritant. - Apply Eucerin or Aquaphor post-shower and at night. - Monitor for rash changes or new symptoms.

## 2024-02-25 NOTE — Assessment & Plan Note (Signed)
 Planning on doing labs before next visit Continue to work on diet and exercise

## 2024-02-25 NOTE — Assessment & Plan Note (Signed)
 Continue to monitor symptoms Will get labs drawn before next visit

## 2024-02-25 NOTE — Assessment & Plan Note (Signed)
 Controlled Continue taking Prolia injection Continue to monitor for any side effects

## 2024-03-26 LAB — CBC WITH DIFFERENTIAL/PLATELET
Absolute Lymphocytes: 1140 {cells}/uL (ref 850–3900)
Absolute Monocytes: 295 {cells}/uL (ref 200–950)
Basophils Absolute: 48 {cells}/uL (ref 0–200)
Basophils Relative: 1.1 %
Eosinophils Absolute: 62 {cells}/uL (ref 15–500)
Eosinophils Relative: 1.4 %
HCT: 44 % (ref 35.0–45.0)
Hemoglobin: 14.6 g/dL (ref 11.7–15.5)
MCH: 31.3 pg (ref 27.0–33.0)
MCHC: 33.2 g/dL (ref 32.0–36.0)
MCV: 94.4 fL (ref 80.0–100.0)
MPV: 10.6 fL (ref 7.5–12.5)
Monocytes Relative: 6.7 %
Neutro Abs: 2856 {cells}/uL (ref 1500–7800)
Neutrophils Relative %: 64.9 %
Platelets: 253 10*3/uL (ref 140–400)
RBC: 4.66 10*6/uL (ref 3.80–5.10)
RDW: 12.3 % (ref 11.0–15.0)
Total Lymphocyte: 25.9 %
WBC: 4.4 10*3/uL (ref 3.8–10.8)

## 2024-03-26 LAB — LIPID PANEL
Cholesterol: 207 mg/dL — ABNORMAL HIGH (ref <200)
HDL: 75 mg/dL (ref >=50)
LDL Cholesterol (Calc): 115 mg/dL — ABNORMAL HIGH
Non-HDL Cholesterol (Calc): 132 mg/dL — ABNORMAL HIGH (ref <130)
Total CHOL/HDL Ratio: 2.8 (calc) (ref <5.0)
Triglycerides: 76 mg/dL (ref <150)

## 2024-03-26 LAB — COMPLETE METABOLIC PANEL WITHOUT GFR
AG Ratio: 2 (calc) (ref 1.0–2.5)
ALT: 11 U/L (ref 6–29)
AST: 19 U/L (ref 10–35)
Albumin: 4.8 g/dL (ref 3.6–5.1)
Alkaline phosphatase (APISO): 49 U/L (ref 37–153)
BUN: 13 mg/dL (ref 7–25)
CO2: 27 mmol/L (ref 20–32)
Calcium: 9.7 mg/dL (ref 8.6–10.4)
Chloride: 104 mmol/L (ref 98–110)
Creat: 0.83 mg/dL (ref 0.50–1.03)
Globulin: 2.4 g/dL (ref 1.9–3.7)
Glucose, Bld: 81 mg/dL (ref 65–99)
Potassium: 4.2 mmol/L (ref 3.5–5.3)
Sodium: 139 mmol/L (ref 135–146)
Total Bilirubin: 0.7 mg/dL (ref 0.2–1.2)
Total Protein: 7.2 g/dL (ref 6.1–8.1)

## 2024-03-26 LAB — MAGNESIUM: Magnesium: 2.4 mg/dL (ref 1.5–2.5)

## 2024-03-26 LAB — TSH: TSH: 1.54 m[IU]/L

## 2024-03-26 LAB — VITAMIN B12: Vitamin B-12: 819 pg/mL (ref 200–1100)

## 2024-03-26 LAB — HEMOGLOBIN A1C
Hgb A1c MFr Bld: 5.5 % (ref <5.7)
Mean Plasma Glucose: 111 mg/dL
eAG (mmol/L): 6.2 mmol/L

## 2024-03-26 LAB — VITAMIN D 25 HYDROXY (VIT D DEFICIENCY, FRACTURES): Vit D, 25-Hydroxy: 71 ng/mL (ref 30–100)

## 2024-03-26 LAB — T4, FREE: Free T4: 1.3 ng/dL (ref 0.8–1.8)

## 2024-03-30 ENCOUNTER — Ambulatory Visit: Payer: Self-pay | Admitting: Physician Assistant

## 2024-03-30 NOTE — Progress Notes (Unsigned)
 Subjective:  Patient ID: Janice Fox, female    DOB: 04-16-1971  Age: 53 y.o. MRN: [id number]  No chief complaint on file.  HPI:  Well Adult Physical: Patient here for a comprehensive physical exam.The patient reports {problems:16946} Do you take any herbs or supplements that were not prescribed by a doctor? {yes/no/not asked:9010} Are you taking calcium supplements? {yes/no:63} Are you taking aspirin daily? {yes/no:63}  Encounter for general adult medical examination without abnormal findings  Physical ("At Risk" items are starred): Patient's last physical exam was 1 year ago .  Patient wears a seat belt, has smoke detectors, has carbon monoxide detectors, practices appropriate gun safety, and wears sunscreen with extended sun exposure. Dental Care: biannual cleanings, brushes and flosses daily. Ophthalmology/Optometry: Annual visit.  Hearing loss: none Vision impairments: none Last PSA:     11/24/2023    2:36 PM 03/28/2023    8:07 AM 03/26/2022    2:03 PM  Depression screen PHQ 2/9  Decreased Interest 0 0 0  Down, Depressed, Hopeless 0 0 0  PHQ - 2 Score 0 0 0  Altered sleeping  0   Tired, decreased energy  0   Change in appetite  0   Feeling bad or failure about yourself   0   Trouble concentrating  0   Moving slowly or fidgety/restless  0   Suicidal thoughts  0   PHQ-9 Score  0   Difficult doing work/chores  Not difficult at all          03/26/2022    2:03 PM 03/28/2023    8:07 AM 11/24/2023    2:36 PM  Fall Risk  Falls in the past year? 0 0 0  Was there an injury with Fall? 0 0 0  Fall Risk Category Calculator 0 0 0  Fall Risk Category (Retired) Low    (RETIRED) Patient Fall Risk Level Low fall risk    Patient at Risk for Falls Due to No Fall Risks No Fall Risks No Fall Risks  Fall risk Follow up Falls evaluation completed Falls evaluation completed Falls evaluation completed              Past Medical History:  Diagnosis Date   History of kidney  stones    Hyperlipidemia    Osteoporosis    Past Surgical History:  Procedure Laterality Date   WISDOM TOOTH EXTRACTION      Family History  Problem Relation Age of Onset   Hypertension Mother    Osteoporosis Mother    Hyperlipidemia Father    Hyperlipidemia Brother    Heart disease Father    Rheum arthritis Maternal Grandmother    Pancreatic cancer Maternal Grandfather    Social History   Socioeconomic History   Marital status: Married    Spouse name: Alenna Russell   Number of children: 3   Years of education: Not on file   Highest education level: Associate degree: occupational, Scientist, product/process development, or vocational program  Occupational History   Occupation: homemaker  Tobacco Use   Smoking status: Never   Smokeless tobacco: Never  Substance and Sexual Activity   Alcohol use: No   Drug use: No   Sexual activity: Yes    Partners: Male    Birth control/protection: Post-menopausal  Other Topics Concern   Not on file  Social History Narrative   Not on file   Social Drivers of Health   Financial Resource Strain: Low Risk  (11/24/2023)   Overall Financial Resource Strain (CARDIA)  Difficulty of Paying Living Expenses: Not hard at all  Food Insecurity: No Food Insecurity (11/24/2023)   Hunger Vital Sign    Worried About Running Out of Food in the Last Year: Never true    Ran Out of Food in the Last Year: Never true  Transportation Needs: No Transportation Needs (11/24/2023)   PRAPARE - Administrator, Civil Service (Medical): No    Lack of Transportation (Non-Medical): No  Physical Activity: Insufficiently Active (11/24/2023)   Exercise Vital Sign    Days of Exercise per Week: 3 days    Minutes of Exercise per Session: 30 min  Stress: No Stress Concern Present (11/24/2023)   [employer] of Occupational Health - Occupational Stress Questionnaire    Feeling of Stress : Only a little  Social Connections: Socially Integrated (11/24/2023)   Social Connection and  Isolation Panel [NHANES]    Frequency of Communication with Friends and Family: More than three times a week    Frequency of Social Gatherings with Friends and Family: Three times a week    Attends Religious Services: More than 4 times per year    Active Member of Clubs or Organizations: Yes    Attends Banker Meetings: More than 4 times per year    Marital Status: Married   Review of Systems  Constitutional:  Negative for appetite change, fatigue and fever.  HENT:  Negative for congestion, ear pain, sinus pressure and sore throat.   Respiratory:  Negative for cough, chest tightness, shortness of breath and wheezing.   Cardiovascular:  Negative for chest pain and palpitations.  Gastrointestinal:  Negative for abdominal pain, constipation, diarrhea, nausea and vomiting.  Genitourinary:  Negative for dysuria and hematuria.  Musculoskeletal:  Negative for arthralgias, back pain, joint swelling and myalgias.  Skin:  Negative for rash.  Neurological:  Negative for dizziness, weakness and headaches.  Psychiatric/Behavioral:  Negative for dysphoric mood. The patient is not nervous/anxious.      Objective:  There were no vitals taken for this visit.     02/25/2024   10:55 AM 11/24/2023    2:41 PM 03/28/2023    8:06 AM  BP/Weight  Systolic BP 110 128 122  Diastolic BP 70 82 68  Wt. (Lbs) 118 117 121  BMI 19.34 kg/m2 19.17 kg/m2 19.83 kg/m2    Physical Exam  Lab Results  Component Value Date   WBC 4.4 03/25/2024   HGB 14.6 03/25/2024   HCT 44.0 03/25/2024   PLT 253 03/25/2024   GLUCOSE 81 03/25/2024   CHOL 207 (H) 03/25/2024   TRIG 76 03/25/2024   HDL 75 03/25/2024   LDLCALC 115 (H) 03/25/2024   ALT 11 03/25/2024   AST 19 03/25/2024   NA 139 03/25/2024   K 4.2 03/25/2024   CL 104 03/25/2024   CREATININE 0.83 03/25/2024   BUN 13 03/25/2024   CO2 27 03/25/2024   TSH 1.54 03/25/2024   HGBA1C 5.5 03/25/2024      Assessment & Plan:  Encounter for general  adult medical examination without abnormal findings     There is no height or weight on file to calculate BMI.   These are the goals we discussed:  Goals   None      This is a list of the screening recommended for you and due dates:  Health Maintenance  Topic Date Due   COVID-19 Vaccine (1) Never done   DTaP/Tdap/Td vaccine (1 - Tdap) Never done   Pneumococcal Vaccination (  1 of 2 - PCV) Never done   Zoster (Shingles) Vaccine (1 of 2) Never done   Pap with HPV screening  05/29/2024   Flu Shot  06/04/2024   Mammogram  10/13/2025   Cologuard (Stool DNA test)  05/14/2026   HPV Vaccine  Aged Out   Meningitis B Vaccine  Aged Out     No orders of the defined types were placed in this encounter.    Follow-up: No follow-ups on file.  An After Visit Summary was printed and given to the patient.   I,Lauren M Auman,acting as a Neurosurgeon for [employer], [employer].,have documented all relevant documentation on the behalf of Odilia Bennett, PA,as directed by  Odilia Bennett, PA while in the presence of Odilia Bennett, [place].    Odilia Bennett, [place] [facility] [phone]

## 2024-03-31 ENCOUNTER — Encounter: Payer: Self-pay | Admitting: Physician Assistant

## 2024-03-31 ENCOUNTER — Ambulatory Visit (INDEPENDENT_AMBULATORY_CARE_PROVIDER_SITE_OTHER): Payer: BC Managed Care – PPO | Admitting: Physician Assistant

## 2024-03-31 VITALS — BP 122/78 | HR 61 | Temp 97.1°F | Ht 65.5 in | Wt 118.0 lb

## 2024-03-31 DIAGNOSIS — E7800 Pure hypercholesterolemia, unspecified: Secondary | ICD-10-CM | POA: Diagnosis not present

## 2024-03-31 DIAGNOSIS — Z0000 Encounter for general adult medical examination without abnormal findings: Secondary | ICD-10-CM | POA: Diagnosis not present

## 2024-03-31 LAB — POCT URINALYSIS DIP (CLINITEK)
Bilirubin, UA: NEGATIVE
Glucose, UA: NEGATIVE mg/dL
Ketones, POC UA: NEGATIVE mg/dL
Leukocytes, UA: NEGATIVE
Nitrite, UA: NEGATIVE
POC PROTEIN,UA: NEGATIVE
Spec Grav, UA: 1.01 (ref 1.010–1.025)
Urobilinogen, UA: NEGATIVE U/dL
pH, UA: 6 (ref 5.0–8.0)

## 2024-03-31 NOTE — Assessment & Plan Note (Signed)
 Cholesterol levels improved slightly. Current LDL is 115, slightly elevated but not requiring medication. Exercise limited but encouraged as feasible. - Continue dietary modifications to include more fish. - Encourage regular exercise as feasible.

## 2024-03-31 NOTE — Assessment & Plan Note (Signed)
 Kidney stones Chronic kidney stones likely related to teriparatide injections. Recent 24-hour urine test shows slight decrease in calcium oxalate saturation. Persistent trace hematuria, no recent stones reported. - Monitor kidney stone formation and symptoms. - Follow up with urologist for 24-hour urine test results. - Consider adjusting teriparatide if kidney stones persist.  Trace hematuria Chronic trace hematuria likely related to kidney stones. No significant findings on urological evaluation. - Monitor urine for changes. - No further intervention unless symptoms change.  Cysts in kidneys Incidental kidney cysts with no impact on kidney function or proteinuria. Not concerning unless associated with other symptoms or family history. - Monitor kidney function and symptoms.  Vitamin D  excess Vitamin D  level increased to 71 with supplementation of 4000 IU daily, approaching upper limit and increasing risk for kidney stones. Recommended level for osteopenia/osteoporosis is around 50. - Discuss with specialist about reducing vitamin D  supplementation to 2000 IU daily. - Recheck vitamin D  levels in a few months after adjustment.

## 2024-03-31 NOTE — Patient Instructions (Signed)
 VISIT SUMMARY:  Janice Fox, you came in today for your annual physical exam. We discussed your cholesterol levels, vitamin D  intake, kidney stones, trace blood in your urine, and kidney cysts. You also mentioned a spot on your neck that resolved and your dietary adjustments to improve stomach issues.  YOUR PLAN:  -KIDNEY STONES: Kidney stones are hard deposits made of minerals and salts that form inside your kidneys. Your recent 24-hour urine test shows a slight decrease in calcium oxalate saturation. We will continue to monitor your kidney stone formation and symptoms. Please follow up with your urologist for the 24-hour urine test results. We may consider adjusting your teriparatide if kidney stones persist.  -TRACE HEMATURIA: Trace hematuria means there is a small amount of blood in your urine. This is likely related to your kidney stones. We will monitor your urine for any changes, but no further intervention is needed unless your symptoms change.  -CYSTS IN KIDNEYS: Kidney cysts are fluid-filled sacs within your kidneys. They are common and usually do not impact kidney function. We will monitor your kidney function and symptoms, but no further action is needed unless you develop other symptoms or have a family history of kidney issues.  -VITAMIN D  EXCESS: Your vitamin D  level has increased to 71 with your current supplementation, which is approaching the upper limit and may increase your risk for kidney stones. We recommend reducing your vitamin D  supplementation to 2000 IU daily. We will recheck your vitamin D  levels in a few months after this adjustment.  -HYPERCHOLESTEROLEMIA: Hypercholesterolemia means you have high cholesterol levels. Your cholesterol levels have improved slightly, with your current LDL at 115. This is slightly elevated but does not require medication at this time. Continue your dietary modifications to include more fish and try to engage in regular exercise as  feasible.  INSTRUCTIONS:  Please follow up with your urologist for the 24-hour urine test results. We will recheck your vitamin D  levels in a few months after reducing your supplementation to 2000 IU daily.

## 2024-04-19 ENCOUNTER — Encounter: Payer: Self-pay | Admitting: Physician Assistant

## 2024-04-19 ENCOUNTER — Ambulatory Visit: Admitting: Physician Assistant

## 2024-04-19 VITALS — BP 110/68 | HR 66 | Temp 97.8°F | Ht 65.5 in | Wt 117.0 lb

## 2024-04-19 DIAGNOSIS — S4992XA Unspecified injury of left shoulder and upper arm, initial encounter: Secondary | ICD-10-CM

## 2024-04-19 DIAGNOSIS — S40022A Contusion of left upper arm, initial encounter: Secondary | ICD-10-CM | POA: Insufficient documentation

## 2024-04-19 NOTE — Assessment & Plan Note (Signed)
 Bruise with palpable knot likely due to hematoma or muscular injury. Blood clot less likely. - Order ultrasound to evaluate knot and rule out blood clot. - Start 81 mg aspirin daily. - Recommend Tylenol or ibuprofen for pain. - Instruct to apply heat and massage area after ultrasound confirms no vascular involvement. - Advise alternating heat and ice for pain after ultrasound. - Educated on emergency signs like shortness of breath or prolonged chest pain.

## 2024-04-19 NOTE — Patient Instructions (Signed)
 VISIT SUMMARY:  Today, you were seen for a persistent bruise and knot on your arm that you noticed after an injury two weeks ago. We discussed your symptoms and concerns, including the potential interaction with your upcoming Prolia shot.  YOUR PLAN:  -ARM HEMATOMA: You have a bruise with a palpable knot, likely due to a hematoma or muscular injury. A hematoma is a collection of blood outside of blood vessels, usually caused by an injury. We will order an ultrasound to evaluate the knot and rule out a blood clot. You should start taking 81 mg of aspirin daily. For pain, you can use Tylenol or ibuprofen. After the ultrasound confirms there is no vascular involvement, you can apply heat and massage the area. Alternating heat and ice can also help with pain. Be aware of emergency signs like shortness of breath or prolonged chest pain.  -GENERAL HEALTH MAINTENANCE: Your Prolia shot is scheduled, and there is no expected interaction with the aspirin. Prolia is a medication used to treat osteoporosis by strengthening bones. Continue with the scheduled Prolia shot.  INSTRUCTIONS:  Please schedule an ultrasound at the medical center to further evaluate your arm. Be sure to answer potential spam calls as they may be related to scheduling. We will review the ultrasound results to determine the next steps in your treatment.

## 2024-04-19 NOTE — Progress Notes (Signed)
 Acute Office Visit  Subjective:    Patient ID: Janice Fox, female    DOB: 1971/04/02, 53 y.o.   MRN: [id number]  Chief Complaint  Patient presents with   Lump/Bruise on left arm    HPI: Patient is in today for Edema on the left bicept.   Discussed the use of AI scribe software for clinical note transcription with the patient, who gave verbal consent to proceed.  History of Present Illness   Janice Fox is a 53 year old female who presents with a persistent bruise and knot on her arm.  Two weeks ago, she bruised her arm while assisting her father with a tiller. She subsequently noticed a knot in the area, which is not painful unless rubbed, causing discomfort described as 'not comfortable'.  She recalls using a screwdriver to release a ratchet strap, which may have contributed to the injury, and also mentions hitting her arm on something else, though specifics are unclear. The soreness is more pronounced above the bruise.  A nurse practitioner at her church suggested it could be a hematoma. She is concerned about potential interactions with her scheduled Prolia shot this week.  No shortness of breath, chest pain, or fever. She does not take aspirin regularly.       Past Medical History:  Diagnosis Date   History of kidney stones    Hyperlipidemia    Osteoporosis     Past Surgical History:  Procedure Laterality Date   WISDOM TOOTH EXTRACTION      Family History  Problem Relation Age of Onset   Hypertension Mother    Osteoporosis Mother    Hyperlipidemia Father    Heart disease Father    Arthritis Father    Diabetes Father    Hyperlipidemia Brother    Rheum arthritis Maternal Grandmother    Arthritis Maternal Grandmother    Pancreatic cancer Maternal Grandfather    Cancer Maternal Grandfather    Stroke Paternal Grandfather     Social History   Socioeconomic History   Marital status: Married    Spouse name: Uchechi Denison   Number of  children: 3   Years of education: Not on file   Highest education level: Associate degree: occupational, Scientist, product/process development, or vocational program  Occupational History   Occupation: homemaker  Tobacco Use   Smoking status: Never   Smokeless tobacco: Never   Tobacco comments:    Never  Substance and Sexual Activity   Alcohol use: Never   Drug use: Never   Sexual activity: Yes    Partners: Male    Birth control/protection: Post-menopausal  Other Topics Concern   Not on file  Social History Narrative   Not on file   Social Drivers of Health   Financial Resource Strain: Low Risk  (04/18/2024)   Overall Financial Resource Strain (CARDIA)    Difficulty of Paying Living Expenses: Not hard at all  Food Insecurity: No Food Insecurity (04/18/2024)   Hunger Vital Sign    Worried About Running Out of Food in the Last Year: Never true    Ran Out of Food in the Last Year: Never true  Transportation Needs: No Transportation Needs (04/18/2024)   PRAPARE - Administrator, Civil Service (Medical): No    Lack of Transportation (Non-Medical): No  Physical Activity: Unknown (04/18/2024)   Exercise Vital Sign    Days of Exercise per Week: Patient declined    Minutes of Exercise per Session: Not on file  Stress: No Stress Concern Present (04/18/2024)   [employer] of Occupational Health - Occupational Stress Questionnaire    Feeling of Stress: Not at all  Social Connections: Socially Integrated (04/18/2024)   Social Connection and Isolation Panel    Frequency of Communication with Friends and Family: More than three times a week    Frequency of Social Gatherings with Friends and Family: More than three times a week    Attends Religious Services: More than 4 times per year    Active Member of [employer] or Organizations: Yes    Attends Engineer, structural: More than 4 times per year    Marital Status: Married  Catering manager Violence: Not At Risk (03/31/2024)   Humiliation, Afraid,  Rape, and Kick questionnaire    Fear of Current or Ex-Partner: No    Emotionally Abused: No    Physically Abused: No    Sexually Abused: No    Outpatient Medications Prior to Visit  Medication Sig Dispense Refill   Calcium Carb-Cholecalciferol 600-20 MG-MCG TABS Take by mouth.     Cholecalciferol (VITAMIN D3) 50 MCG (2000 UT) capsule Take 2,000 Units by mouth daily.     PROLIA 60 MG/ML SOSY injection Inject 60 mg into the skin every 6 (six) months.     Sodium Fluoride (CLINPRO 5000) 1.1 % PSTE      No facility-administered medications prior to visit.    No Known Allergies  Review of Systems  Constitutional:  Negative for appetite change, fatigue and fever.  HENT:  Negative for congestion, ear pain, sinus pressure and sore throat.   Respiratory:  Negative for cough, chest tightness, shortness of breath and wheezing.   Cardiovascular:  Negative for chest pain and palpitations.  Gastrointestinal:  Negative for abdominal pain, constipation, diarrhea, nausea and vomiting.  Genitourinary:  Negative for dysuria and hematuria.  Musculoskeletal:  Negative for arthralgias, back pain, joint swelling and myalgias.  Skin:  Negative for rash.       Lump/Bruise on left arm  Neurological:  Negative for dizziness, weakness and headaches.  Psychiatric/Behavioral:  Negative for dysphoric mood. The patient is not nervous/anxious.        Objective:        04/19/2024    9:46 AM 03/31/2024    8:16 AM 02/25/2024   10:55 AM  Vitals with BMI  Height 5' 5.5 5' 5.5 5' 5.5  Weight 117 lbs 118 lbs 118 lbs  BMI 19.17 19.33 19.33  Systolic 110 122 161  Diastolic 68 78 70  Pulse 66 61 65    Orthostatic VS for the past 72 hrs (Last 3 readings):  Patient Position BP Location Cuff Size  04/19/24 0946 Sitting Left Arm Small     Physical Exam Vitals reviewed.  Constitutional:      Appearance: Normal appearance.  Neck:     Vascular: No carotid bruit.   Cardiovascular:     Rate and Rhythm:  Normal rate and regular rhythm.     Heart sounds: Normal heart sounds.  Pulmonary:     Effort: Pulmonary effort is normal.     Breath sounds: Normal breath sounds.  Abdominal:     General: Bowel sounds are normal.     Palpations: Abdomen is soft.     Tenderness: There is no abdominal tenderness.   Musculoskeletal:     Right upper arm: Normal.     Left upper arm: Swelling, edema and tenderness present.       Arms:   Neurological:  Mental Status: She is alert and oriented to person, place, and time.   Psychiatric:        Mood and Affect: Mood normal.        Behavior: Behavior normal.     Health Maintenance Due  Topic Date Due   COVID-19 Vaccine (1) Never done   DTaP/Tdap/Td (1 - Tdap) Never done   Cervical Cancer Screening (HPV/Pap Cotest)  05/29/2024    There are no preventive care reminders to display for this patient.   Lab Results  Component Value Date   TSH 1.54 03/25/2024   Lab Results  Component Value Date   WBC 4.4 03/25/2024   HGB 14.6 03/25/2024   HCT 44.0 03/25/2024   MCV 94.4 03/25/2024   PLT 253 03/25/2024   Lab Results  Component Value Date   NA 139 03/25/2024   K 4.2 03/25/2024   CO2 27 03/25/2024   GLUCOSE 81 03/25/2024   BUN 13 03/25/2024   CREATININE 0.83 03/25/2024   BILITOT 0.7 03/25/2024   AST 19 03/25/2024   ALT 11 03/25/2024   PROT 7.2 03/25/2024   CALCIUM 9.7 03/25/2024   EGFR 86.0 10/09/2023   Lab Results  Component Value Date   CHOL 207 (H) 03/25/2024   Lab Results  Component Value Date   HDL 75 03/25/2024   Lab Results  Component Value Date   LDLCALC 115 (H) 03/25/2024   Lab Results  Component Value Date   TRIG 76 03/25/2024   Lab Results  Component Value Date   CHOLHDL 2.8 03/25/2024   Lab Results  Component Value Date   HGBA1C 5.5 03/25/2024       Assessment & Plan:  Injury of left upper extremity, initial encounter -     VAS US  UPPER EXTREMITY VENOUS DUPLEX; Future  Hematoma of arm, left,  initial encounter Assessment & Plan: Bruise with palpable knot likely due to hematoma or muscular injury. Blood clot less likely. - Order ultrasound to evaluate knot and rule out blood clot. - Start 81 mg aspirin daily. - Recommend Tylenol or ibuprofen for pain. - Instruct to apply heat and massage area after ultrasound confirms no vascular involvement. - Advise alternating heat and ice for pain after ultrasound. - Educated on emergency signs like shortness of breath or prolonged chest pain.     General Health Maintenance Prolia shot scheduled with no interaction expected with aspirin. - Administer Prolia shot as scheduled.  Follow-up Ultrasound needed for further evaluation of arm. - Schedule ultrasound at medical center. - Advise to answer potential spam calls for scheduling. - Review ultrasound results for further management.         No orders of the defined types were placed in this encounter.   Orders Placed This Encounter  Procedures   VAS US  UPPER EXTREMITY VENOUS DUPLEX     Follow-up: Return if symptoms worsen or fail to improve, for [facility].  An After Visit Summary was printed and given to the patient.   I,Lauren M Auman,acting as a Neurosurgeon for [employer], [employer].,have documented all relevant documentation on the behalf of Odilia Bennett, PA,as directed by  Odilia Bennett, PA while in the presence of Odilia Bennett, [place].    Odilia Bennett, [place] [facility] [phone]

## 2024-04-20 ENCOUNTER — Other Ambulatory Visit: Payer: Self-pay | Admitting: Physician Assistant

## 2024-04-20 DIAGNOSIS — S40022A Contusion of left upper arm, initial encounter: Secondary | ICD-10-CM

## 2024-04-21 ENCOUNTER — Ambulatory Visit (HOSPITAL_BASED_OUTPATIENT_CLINIC_OR_DEPARTMENT_OTHER)
Admission: RE | Admit: 2024-04-21 | Discharge: 2024-04-21 | Disposition: A | Discharge status: Home / Self Care | Source: Ambulatory Visit | Attending: Physician Assistant | Admitting: Radiology

## 2024-04-21 DIAGNOSIS — S40022A Contusion of left upper arm, initial encounter: Secondary | ICD-10-CM

## 2024-04-25 ENCOUNTER — Encounter: Payer: Self-pay | Admitting: Physician Assistant

## 2024-04-29 ENCOUNTER — Ambulatory Visit: Payer: Self-pay | Admitting: Physician Assistant

## 2024-04-30 ENCOUNTER — Ambulatory Visit: Admitting: Physician Assistant

## 2024-04-30 ENCOUNTER — Encounter: Payer: Self-pay | Admitting: Physician Assistant

## 2024-04-30 VITALS — BP 100/50 | HR 51 | Temp 97.3°F | Resp 14 | Ht 65.5 in | Wt 115.0 lb

## 2024-04-30 DIAGNOSIS — S40022A Contusion of left upper arm, initial encounter: Secondary | ICD-10-CM

## 2024-04-30 NOTE — Assessment & Plan Note (Signed)
 Small hematoma likely due to trauma, separate from major vessels or nerves. Ultrasound suggests hematoma or cyst. May take up to six weeks to resolve. Massaging and heat can aid healing. - Discontinue aspirin. - Continue massaging with heat, especially post-shower. - Massage morning and night, and if achy or enlarged. - Report significant changes in size or symptoms.

## 2024-04-30 NOTE — Patient Instructions (Signed)
 VISIT SUMMARY:  During your visit, we discussed the persistent bruise and bump on your arm, as well as your recent low blood pressure readings. We reviewed your ultrasound results and talked about your symptoms and current treatments.  YOUR PLAN:  -LEFT ARM HEMATOMA: A hematoma is a collection of blood outside of blood vessels, usually caused by an injury. Your ultrasound suggests that the bump on your arm is likely a small hematoma or cyst, which may take up to six weeks to heal. You should stop taking aspirin and continue massaging the area with heat, especially after a shower. Massage the area in the morning and at night, and if it becomes achy or enlarged. Please report any significant changes in size or symptoms.  -HYPOTENSION: Hypotension means low blood pressure. Your recent reading was 100/50 mmHg with a heart rate of 50 bpm, which is lower than usual for you. Since you have no symptoms and are well-hydrated, this is likely due to benign factors such as excitement. Please recheck your blood pressure to monitor for persistent low readings and stay hydrated with electrolytes, especially during your beach trip.  INSTRUCTIONS:  Please report any significant changes in the size or symptoms of your arm bump. Recheck your blood pressure to monitor for persistent low readings. Stay hydrated with electrolytes during your beach trip.

## 2024-04-30 NOTE — Progress Notes (Signed)
 Acute Office Visit  Subjective:    Patient ID: Janice Fox, female    DOB: 04/07/1971, 53 y.o.   MRN: [id number]  Chief Complaint  Patient presents with   Lump on left arm    HPI: Patient is in today for lump on left arm after she bruised it when she was unhook a wrech in her truck. She was seen 04/19/24 and ultrasound was ordered, but it was Indeterminate superficial structure in the left upper arm. This structure is poorly defined and has a small central hypoechoic area. Findings are nonspecific but could represent a small hematoma. Recommend clinical follow-up to ensure resolution.  She states is not painful, but it burns when she touches it many times.  Discussed the use of AI scribe software for clinical note transcription with the patient, who gave verbal consent to proceed.  History of Present Illness   Janice Fox Janice Fox is a 53 year old female who presents with a persistent arm bruise and bump.  She has a persistent bruise and bump on her arm, with the bruise improving but the bump remaining. The bump causes a deep ache without sharp or stabbing pain. No significant numbness or tingling, though she experienced a transient tingling sensation once.  An ultrasound did not indicate a blood clot. The report mentioned a poorly defined structure near the left cephalic vein, possibly describing another structure adjacent to it. She notes the bump sometimes disappears after a hot shower, returning once her body cools down.  She has been massaging the area and using heat, which causes temporary redness and inflammation. Massaging leads to a sensation of 'crunchiness,' which she associates with inflammation or coagulated blood.  Her blood pressure was noted to be low at a recent check, with a reading of around 100 and a heart rate in the 50s, lower than her usual. No dizziness and she maintains good hydration, drinking water and milk regularly.       Past Medical History:   Diagnosis Date   History of kidney stones    Hyperlipidemia    Osteoporosis     Past Surgical History:  Procedure Laterality Date   WISDOM TOOTH EXTRACTION      Family History  Problem Relation Age of Onset   Hypertension Mother    Osteoporosis Mother    Hyperlipidemia Father    Heart disease Father    Arthritis Father    Diabetes Father    Hyperlipidemia Brother    Rheum arthritis Maternal Grandmother    Arthritis Maternal Grandmother    Pancreatic cancer Maternal Grandfather    Cancer Maternal Grandfather    Stroke Paternal Grandfather     Social History   Socioeconomic History   Marital status: Married    Spouse name: Janann Boeve   Number of children: 3   Years of education: Not on file   Highest education level: Associate degree: occupational, Scientist, product/process development, or vocational program  Occupational History   Occupation: homemaker  Tobacco Use   Smoking status: Never   Smokeless tobacco: Never   Tobacco comments:    Never  Substance and Sexual Activity   Alcohol use: Never   Drug use: Never   Sexual activity: Yes    Partners: Male    Birth control/protection: Post-menopausal  Other Topics Concern   Not on file  Social History Narrative   Not on file   Social Drivers of Health   Financial Resource Strain: Low Risk  (04/18/2024)   Overall Financial  Resource Strain (CARDIA)    Difficulty of Paying Living Expenses: Not hard at all  Food Insecurity: No Food Insecurity (04/18/2024)   Hunger Vital Sign    Worried About Running Out of Food in the Last Year: Never true    Ran Out of Food in the Last Year: Never true  Transportation Needs: No Transportation Needs (04/18/2024)   PRAPARE - Administrator, Civil Service (Medical): No    Lack of Transportation (Non-Medical): No  Physical Activity: Unknown (04/18/2024)   Exercise Vital Sign    Days of Exercise per Week: Patient declined    Minutes of Exercise per Session: Not on file  Stress: No Stress  Concern Present (04/18/2024)   [employer] of Occupational Health - Occupational Stress Questionnaire    Feeling of Stress: Not at all  Social Connections: Socially Integrated (04/18/2024)   Social Connection and Isolation Panel    Frequency of Communication with Friends and Family: More than three times a week    Frequency of Social Gatherings with Friends and Family: More than three times a week    Attends Religious Services: More than 4 times per year    Active Member of [employer] or Organizations: Yes    Attends Engineer, structural: More than 4 times per year    Marital Status: Married  Catering manager Violence: Not At Risk (03/31/2024)   Humiliation, Afraid, Rape, and Kick questionnaire    Fear of Current or Ex-Partner: No    Emotionally Abused: No    Physically Abused: No    Sexually Abused: No    Outpatient Medications Prior to Visit  Medication Sig Dispense Refill   Calcium Carb-Cholecalciferol 600-20 MG-MCG TABS Take by mouth.     Cholecalciferol (VITAMIN D3) 50 MCG (2000 UT) capsule Take 2,000 Units by mouth daily.     PROLIA 60 MG/ML SOSY injection Inject 60 mg into the skin every 6 (six) months.     Sodium Fluoride (CLINPRO 5000) 1.1 % PSTE      No facility-administered medications prior to visit.    No Known Allergies  Review of Systems  Constitutional:  Negative for chills, fatigue and fever.  HENT:  Negative for congestion, ear pain and sore throat.   Respiratory:  Negative for cough and shortness of breath.   Cardiovascular:  Negative for chest pain and palpitations.  Gastrointestinal:  Negative for abdominal pain, constipation, diarrhea, nausea and vomiting.  Endocrine: Negative for polydipsia, polyphagia and polyuria.  Genitourinary:  Negative for difficulty urinating and dysuria.  Musculoskeletal:  Negative for arthralgias, back pain and myalgias.  Skin:  Negative for rash.  Neurological:  Negative for headaches.  Psychiatric/Behavioral:   Negative for dysphoric mood. The patient is not nervous/anxious.        Objective:        04/30/2024    9:20 AM 04/19/2024    9:46 AM 03/31/2024    8:16 AM  Vitals with BMI  Height 5' 5.5 5' 5.5 5' 5.5  Weight 115 lbs 117 lbs 118 lbs  BMI 18.84 19.17 19.33  Systolic 100 110 877  Diastolic 50 68 78  Pulse 51 66 61    No data found.   Physical Exam Vitals reviewed.  Constitutional:      Appearance: Normal appearance.   Cardiovascular:     Rate and Rhythm: Normal rate and regular rhythm.     Heart sounds: Normal heart sounds.  Pulmonary:     Effort: Pulmonary effort is normal.  Breath sounds: Normal breath sounds.  Abdominal:     General: Bowel sounds are normal.     Palpations: Abdomen is soft.     Tenderness: There is no abdominal tenderness.   Musculoskeletal:       Arms:     Comments: Hematoma improved Still crepitus felt Improved after minor massage.     Neurological:     Mental Status: She is alert and oriented to person, place, and time.   Psychiatric:        Mood and Affect: Mood normal.        Behavior: Behavior normal.     Health Maintenance Due  Topic Date Due   COVID-19 Vaccine (1) Never done   DTaP/Tdap/Td (1 - Tdap) Never done   Hepatitis B Vaccines (1 of 3 - 19+ 3-dose series) Never done   Cervical Cancer Screening (HPV/Pap Cotest)  05/29/2024       Topic Date Due   Hepatitis B Vaccines (1 of 3 - 19+ 3-dose series) Never done     Lab Results  Component Value Date   TSH 1.54 03/25/2024   Lab Results  Component Value Date   WBC 4.4 03/25/2024   HGB 14.6 03/25/2024   HCT 44.0 03/25/2024   MCV 94.4 03/25/2024   PLT 253 03/25/2024   Lab Results  Component Value Date   NA 139 03/25/2024   K 4.2 03/25/2024   CO2 27 03/25/2024   GLUCOSE 81 03/25/2024   BUN 13 03/25/2024   CREATININE 0.83 03/25/2024   BILITOT 0.7 03/25/2024   AST 19 03/25/2024   ALT 11 03/25/2024   PROT 7.2 03/25/2024   CALCIUM 9.7 03/25/2024   EGFR  86.0 10/09/2023   Lab Results  Component Value Date   CHOL 207 (H) 03/25/2024   Lab Results  Component Value Date   HDL 75 03/25/2024   Lab Results  Component Value Date   LDLCALC 115 (H) 03/25/2024   Lab Results  Component Value Date   TRIG 76 03/25/2024   Lab Results  Component Value Date   CHOLHDL 2.8 03/25/2024   Lab Results  Component Value Date   HGBA1C 5.5 03/25/2024       Assessment & Plan:  Hematoma of arm, left, initial encounter Assessment & Plan: Small hematoma likely due to trauma, separate from major vessels or nerves. Ultrasound suggests hematoma or cyst. May take up to six weeks to resolve. Massaging and heat can aid healing. - Discontinue aspirin. - Continue massaging with heat, especially post-shower. - Massage morning and night, and if achy or enlarged. - Report significant changes in size or symptoms.     Hypotension Blood pressure 100/50 mmHg, heart rate 50 bpm, lower than usual. No symptoms reported. Likely benign factors such as excitement. Well-hydrated and compensating well. - Recheck blood pressure for persistent low readings. - Encourage hydration with electrolytes during beach trip.          No orders of the defined types were placed in this encounter.   No orders of the defined types were placed in this encounter.    Follow-up: No follow-ups on file.  An After Visit Summary was printed and given to the patient.  Nola Angles, [place] [facility] [phone]

## 2024-08-20 ENCOUNTER — Encounter: Payer: Self-pay | Admitting: Physician Assistant

## 2024-08-20 ENCOUNTER — Ambulatory Visit: Admitting: Physician Assistant

## 2024-08-20 VITALS — BP 128/68 | HR 71 | Temp 97.8°F | Ht 65.5 in | Wt 115.0 lb

## 2024-08-20 DIAGNOSIS — J029 Acute pharyngitis, unspecified: Secondary | ICD-10-CM | POA: Diagnosis not present

## 2024-08-20 DIAGNOSIS — B9689 Other specified bacterial agents as the cause of diseases classified elsewhere: Secondary | ICD-10-CM | POA: Insufficient documentation

## 2024-08-20 DIAGNOSIS — J329 Chronic sinusitis, unspecified: Secondary | ICD-10-CM

## 2024-08-20 LAB — POC COVID19 BINAXNOW: SARS Coronavirus 2 Ag: NEGATIVE

## 2024-08-20 LAB — POCT RAPID STREP A (OFFICE): Rapid Strep A Screen: NEGATIVE

## 2024-08-20 LAB — POCT INFLUENZA A/B
Influenza A, POC: NEGATIVE
Influenza B, POC: NEGATIVE

## 2024-08-20 MED ORDER — AZITHROMYCIN 250 MG PO TABS: ORAL_TABLET | ORAL | 0 refills | Status: AC

## 2024-08-20 MED ORDER — PREDNISONE 20 MG PO TABS: ORAL_TABLET | ORAL | 0 refills | Status: AC

## 2024-08-20 NOTE — Assessment & Plan Note (Addendum)
 Continue to monitor symptoms Strep test was negative Will test again if symptoms persist.  Orders:   POCT Influenza A/B   POC COVID-19 BinaxNow   POCT rapid strep A   azithromycin (ZITHROMAX) 250 MG tablet; Take 2 tablets on day 1, then 1 tablet daily on days 2 through 5   predniSONE (DELTASONE) 20 MG tablet; Take 3 tablets (60 mg total) by mouth daily with breakfast for 3 days, THEN 2 tablets (40 mg total) daily with breakfast for 3 days, THEN 1 tablet (20 mg total) daily with breakfast for 3 days.

## 2024-08-20 NOTE — Progress Notes (Signed)
 Acute Office Visit  Subjective:    Patient ID: Janice Fox, female    DOB: [birth year], 53 y.o.   MRN: [id number]  Chief Complaint  Patient presents with   Sore Throat    HPI: Patient is in today for sinus pain and sore throat  Discussed the use of AI scribe software for clinical note transcription with the patient, who gave verbal consent to proceed.  History of Present Illness Janice Fox is a 53 year old female who presents with a sore throat and cough.  She has been experiencing a sore throat and cough for nearly three weeks, with symptoms starting on September 27. The cough is dry and occurs primarily when talking, without associated congestion. She also had a runny nose that was initially clear, turned yellowish, and then returned to clear. The sore throat is intermittent, switching sides from right to left, and sometimes causes discomfort in her ear. The sore throat tends to worsen throughout the day but improves at night.  She has not experienced any fever during this period and has not taken any medications like Tylenol for symptom relief. Her teeth felt sore at times, which she attributes to sinus issues, but this symptom resolves on its own.  Her husband had a similar illness starting on September 20, with a cough and sore throat, and was diagnosed with COVID-19 on September 22, despite negative home tests.  She has taken expired home COVID tests, which were negative. She is concerned about her prolonged symptoms, especially with her daughter's upcoming brain surgery in November, and desires to recover fully before then.     Past Medical History:  Diagnosis Date   History of kidney stones    Hyperlipidemia    Osteoporosis     Past Surgical History:  Procedure Laterality Date   WISDOM TOOTH EXTRACTION      Family History  Problem Relation Age of Onset   Hypertension Mother    Osteoporosis Mother    Hyperlipidemia Father    Heart disease  Father    Arthritis Father    Diabetes Father    Hyperlipidemia Brother    Rheum arthritis Maternal Grandmother    Arthritis Maternal Grandmother    Pancreatic cancer Maternal Grandfather    Cancer Maternal Grandfather    Stroke Paternal Grandfather     Social History   Socioeconomic History   Marital status: Married    Spouse name: Andreal Vultaggio   Number of children: 3   Years of education: Not on file   Highest education level: Associate degree: occupational, Scientist, product/process development, or vocational program  Occupational History   Occupation: homemaker  Tobacco Use   Smoking status: Never   Smokeless tobacco: Never   Tobacco comments:    Never  Substance and Sexual Activity   Alcohol use: Never   Drug use: Never   Sexual activity: Yes    Partners: Male    Birth control/protection: Post-menopausal  Other Topics Concern   Not on file  Social History Narrative   Not on file   Social Drivers of Health   Financial Resource Strain: Low Risk  (08/19/2024)   Overall Financial Resource Strain (CARDIA)    Difficulty of Paying Living Expenses: Not hard at all  Food Insecurity: No Food Insecurity (08/19/2024)   Hunger Vital Sign    Worried About Running Out of Food in the Last Year: Never true    Ran Out of Food in the Last Year: Never true  Transportation  Needs: No Transportation Needs (08/19/2024)   PRAPARE - Administrator, Civil Service (Medical): No    Lack of Transportation (Non-Medical): No  Physical Activity: Inactive (08/19/2024)   Exercise Vital Sign    Days of Exercise per Week: 0 days    Minutes of Exercise per Session: Not on file  Stress: No Stress Concern Present (08/19/2024)   [employer] of Occupational Health - Occupational Stress Questionnaire    Feeling of Stress: Only a little  Social Connections: Socially Integrated (08/19/2024)   Social Connection and Isolation Panel    Frequency of Communication with Friends and Family: More than three times a  week    Frequency of Social Gatherings with Friends and Family: More than three times a week    Attends Religious Services: More than 4 times per year    Active Member of [employer] or Organizations: Yes    Attends Engineer, structural: More than 4 times per year    Marital Status: Married  Catering manager Violence: Not At Risk (03/31/2024)   Humiliation, Afraid, Rape, and Kick questionnaire    Fear of Current or Ex-Partner: No    Emotionally Abused: No    Physically Abused: No    Sexually Abused: No    Outpatient Medications Prior to Visit  Medication Sig Dispense Refill   Calcium Carb-Cholecalciferol 600-20 MG-MCG TABS Take by mouth.     Cholecalciferol (VITAMIN D3) 50 MCG (2000 UT) capsule Take 2,000 Units by mouth daily.     PROLIA 60 MG/ML SOSY injection Inject 60 mg into the skin every 6 (six) months.     Sodium Fluoride (CLINPRO 5000) 1.1 % PSTE      No facility-administered medications prior to visit.    No Known Allergies  Review of Systems  Constitutional:  Negative for appetite change, fatigue and fever.  HENT:  Positive for congestion and sore throat. Negative for ear pain and sinus pressure.   Respiratory:  Positive for cough. Negative for chest tightness, shortness of breath and wheezing.   Cardiovascular:  Negative for chest pain and palpitations.  Gastrointestinal:  Negative for abdominal pain, constipation, diarrhea, nausea and vomiting.  Genitourinary:  Negative for dysuria and hematuria.  Musculoskeletal:  Negative for arthralgias, back pain, joint swelling and myalgias.  Skin:  Negative for rash.  Neurological:  Negative for dizziness, weakness and headaches.  Psychiatric/Behavioral:  Negative for dysphoric mood. The patient is not nervous/anxious.        Objective:        08/20/2024   10:33 AM 04/30/2024    9:20 AM 04/19/2024    9:46 AM  Vitals with BMI  Height 5' 5.5 5' 5.5 5' 5.5  Weight 115 lbs 115 lbs 117 lbs  BMI 18.84 18.84 19.17   Systolic 128 100 889  Diastolic 68 50 68  Pulse 71 51 66    Orthostatic VS for the past 72 hrs (Last 3 readings):  Patient Position BP Location  08/20/24 1033 Sitting Left Arm     Physical Exam Vitals reviewed.  Constitutional:      Appearance: Normal appearance.  HENT:     Right Ear: Drainage present. A middle ear effusion is present.     Left Ear: No drainage.  No middle ear effusion.     Nose: Congestion and rhinorrhea present.     Mouth/Throat:     Pharynx: Posterior oropharyngeal erythema and postnasal drip present. No oropharyngeal exudate.     Tonsils: No tonsillar exudate.  Neck:     Vascular: No carotid bruit.  Cardiovascular:     Rate and Rhythm: Normal rate and regular rhythm.     Heart sounds: Normal heart sounds.  Pulmonary:     Effort: Pulmonary effort is normal.     Breath sounds: Normal breath sounds.  Abdominal:     General: Bowel sounds are normal.     Palpations: Abdomen is soft.     Tenderness: There is no abdominal tenderness.  Neurological:     Mental Status: She is alert and oriented to person, place, and time.  Psychiatric:        Mood and Affect: Mood normal.        Behavior: Behavior normal.     Health Maintenance Due  Topic Date Due   COVID-19 Vaccine (1) Never done   DTaP/Tdap/Td (1 - Tdap) Never done   Pneumococcal Vaccine: 50+ Years (1 of 2 - PCV) Never done   Hepatitis B Vaccines 19-59 Average Risk (1 of 3 - 19+ 3-dose series) Never done   Zoster Vaccines- Shingrix (1 of 2) Never done   Cervical Cancer Screening (HPV/Pap Cotest)  05/29/2024   Influenza Vaccine  Never done       Topic Date Due   Hepatitis B Vaccines 19-59 Average Risk (1 of 3 - 19+ 3-dose series) Never done     Lab Results  Component Value Date   TSH 1.54 03/25/2024   Lab Results  Component Value Date   WBC 4.4 03/25/2024   HGB 14.6 03/25/2024   HCT 44.0 03/25/2024   MCV 94.4 03/25/2024   PLT 253 03/25/2024   Lab Results  Component Value Date   NA  139 03/25/2024   K 4.2 03/25/2024   CO2 27 03/25/2024   GLUCOSE 81 03/25/2024   BUN 13 03/25/2024   CREATININE 0.83 03/25/2024   BILITOT 0.7 03/25/2024   AST 19 03/25/2024   ALT 11 03/25/2024   PROT 7.2 03/25/2024   CALCIUM 9.7 03/25/2024   EGFR 86.0 10/09/2023   Lab Results  Component Value Date   CHOL 207 (H) 03/25/2024   Lab Results  Component Value Date   HDL 75 03/25/2024   Lab Results  Component Value Date   LDLCALC 115 (H) 03/25/2024   Lab Results  Component Value Date   TRIG 76 03/25/2024   Lab Results  Component Value Date   CHOLHDL 2.8 03/25/2024   Lab Results  Component Value Date   HGBA1C 5.5 03/25/2024        Results for orders placed or performed in visit on 08/20/24  POCT rapid strep A   Collection Time: 08/20/24 11:00 AM  Result Value Ref Range   Rapid Strep A Screen Negative Negative  POCT Influenza A/B   Collection Time: 08/20/24 11:01 AM  Result Value Ref Range   Influenza A, POC Negative Negative   Influenza B, POC Negative Negative  POC COVID-19 BinaxNow   Collection Time: 08/20/24 11:01 AM  Result Value Ref Range   SARS Coronavirus 2 Ag Negative Negative     Assessment & Plan:   Assessment & Plan Sore throat Continue to monitor symptoms Strep test was negative Will test again if symptoms persist.  Orders:   POCT Influenza A/B   POC COVID-19 BinaxNow   POCT rapid strep A   azithromycin (ZITHROMAX) 250 MG tablet; Take 2 tablets on day 1, then 1 tablet daily on days 2 through 5   predniSONE (DELTASONE) 20 MG tablet; Take  3 tablets (60 mg total) by mouth daily with breakfast for 3 days, THEN 2 tablets (40 mg total) daily with breakfast for 3 days, THEN 1 tablet (20 mg total) daily with breakfast for 3 days.  Sinusitis, bacterial Acute sinusitis Symptoms persisted for three weeks with sore throat, dry cough, and sinus pressure. Negative COVID and flu tests. Strep test not sent due to negative rapid results. Treatment for sinus  infection warranted. - Prescribed azithromycin. - Prescribed prednisone starting at 60 mg for three days, tapering to 20 mg on the last day. - Advised spreading prednisone doses throughout the day, cautioning against nighttime dose to avoid insomnia. - Sent prescriptions to [facility] in [place].       Body mass index is 18.85 kg/m.SABRA Assessment and Plan Assessment & Plan   Recording duration: 15 minutes    Meds ordered this encounter  Medications   azithromycin (ZITHROMAX) 250 MG tablet    Sig: Take 2 tablets on day 1, then 1 tablet daily on days 2 through 5    Dispense:  6 tablet    Refill:  0   predniSONE (DELTASONE) 20 MG tablet    Sig: Take 3 tablets (60 mg total) by mouth daily with breakfast for 3 days, THEN 2 tablets (40 mg total) daily with breakfast for 3 days, THEN 1 tablet (20 mg total) daily with breakfast for 3 days.    Dispense:  18 tablet    Refill:  0    Orders Placed This Encounter  Procedures   POCT Influenza A/B   POC COVID-19 BinaxNow   POCT rapid strep A     Follow-up: No follow-ups on file.  An After Visit Summary was printed and given to the patient.    I,Lauren M Auman,acting as a Neurosurgeon for [employer], [employer].,have documented all relevant documentation on the behalf of Nola Angles, PA,as directed by  Nola Angles, PA while in the presence of Nola Angles, [place].   Nola Angles, [place] [facility] [phone]

## 2024-08-20 NOTE — Assessment & Plan Note (Signed)
 Acute sinusitis Symptoms persisted for three weeks with sore throat, dry cough, and sinus pressure. Negative COVID and flu tests. Strep test not sent due to negative rapid results. Treatment for sinus infection warranted. - Prescribed azithromycin. - Prescribed prednisone starting at 60 mg for three days, tapering to 20 mg on the last day. - Advised spreading prednisone doses throughout the day, cautioning against nighttime dose to avoid insomnia. - Sent prescriptions to [facility] in [place].

## 2025-04-01 ENCOUNTER — Encounter: Admitting: Physician Assistant
